# Patient Record
Sex: Male | Born: 1970
Health system: Southern US, Community
[De-identification: ages and names within clinical notes are randomized; demographics above are authoritative.]

## PROBLEM LIST (undated history)

## (undated) DIAGNOSIS — F988 Other specified behavioral and emotional disorders with onset usually occurring in childhood and adolescence: Secondary | ICD-10-CM

## (undated) DIAGNOSIS — M5020 Other cervical disc displacement, unspecified cervical region: Secondary | ICD-10-CM

## (undated) DIAGNOSIS — M542 Cervicalgia: Secondary | ICD-10-CM

## (undated) HISTORY — DX: Cervicalgia: M54.2

## (undated) HISTORY — PX: KNEE ARTHROSCOPY: SUR90

## (undated) HISTORY — DX: Other specified behavioral and emotional disorders with onset usually occurring in childhood and adolescence: F98.8

## (undated) HISTORY — DX: Other cervical disc displacement, unspecified cervical region: M50.20

---

## 2005-04-26 ENCOUNTER — Ambulatory Visit: Payer: Self-pay | Admitting: Family Medicine

## 2006-06-03 ENCOUNTER — Ambulatory Visit: Payer: Self-pay | Admitting: Internal Medicine

## 2006-06-04 ENCOUNTER — Ambulatory Visit: Payer: Self-pay | Admitting: Cardiology

## 2006-07-11 ENCOUNTER — Ambulatory Visit: Payer: Self-pay | Admitting: Internal Medicine

## 2006-10-21 ENCOUNTER — Ambulatory Visit: Payer: Self-pay | Admitting: Internal Medicine

## 2006-11-08 ENCOUNTER — Ambulatory Visit: Payer: Self-pay | Admitting: *Deleted

## 2006-11-11 ENCOUNTER — Ambulatory Visit: Payer: Self-pay | Admitting: Internal Medicine

## 2006-11-13 DIAGNOSIS — F988 Other specified behavioral and emotional disorders with onset usually occurring in childhood and adolescence: Secondary | ICD-10-CM

## 2007-01-06 ENCOUNTER — Ambulatory Visit: Payer: Self-pay | Admitting: *Deleted

## 2007-05-12 ENCOUNTER — Telehealth: Payer: Self-pay | Admitting: Internal Medicine

## 2007-06-24 ENCOUNTER — Telehealth (INDEPENDENT_AMBULATORY_CARE_PROVIDER_SITE_OTHER): Payer: Self-pay | Admitting: *Deleted

## 2007-07-18 ENCOUNTER — Ambulatory Visit: Payer: Self-pay | Admitting: Internal Medicine

## 2007-07-18 LAB — CONVERTED CEMR LAB
Bilirubin Urine: NEGATIVE
Blood in Urine, dipstick: NEGATIVE
Nitrite: NEGATIVE
Urobilinogen, UA: NEGATIVE

## 2007-07-22 ENCOUNTER — Ambulatory Visit: Payer: Self-pay | Admitting: Internal Medicine

## 2007-08-01 ENCOUNTER — Telehealth (INDEPENDENT_AMBULATORY_CARE_PROVIDER_SITE_OTHER): Payer: Self-pay | Admitting: *Deleted

## 2007-08-05 ENCOUNTER — Encounter: Payer: Self-pay | Admitting: Internal Medicine

## 2007-09-10 ENCOUNTER — Telehealth (INDEPENDENT_AMBULATORY_CARE_PROVIDER_SITE_OTHER): Payer: Self-pay | Admitting: *Deleted

## 2007-10-20 ENCOUNTER — Telehealth (INDEPENDENT_AMBULATORY_CARE_PROVIDER_SITE_OTHER): Payer: Self-pay | Admitting: *Deleted

## 2007-11-21 ENCOUNTER — Telehealth (INDEPENDENT_AMBULATORY_CARE_PROVIDER_SITE_OTHER): Payer: Self-pay | Admitting: *Deleted

## 2008-01-01 ENCOUNTER — Telehealth (INDEPENDENT_AMBULATORY_CARE_PROVIDER_SITE_OTHER): Payer: Self-pay | Admitting: *Deleted

## 2008-02-10 ENCOUNTER — Ambulatory Visit: Payer: Self-pay | Admitting: Internal Medicine

## 2008-03-05 ENCOUNTER — Telehealth: Payer: Self-pay | Admitting: Internal Medicine

## 2008-04-12 ENCOUNTER — Telehealth (INDEPENDENT_AMBULATORY_CARE_PROVIDER_SITE_OTHER): Payer: Self-pay | Admitting: *Deleted

## 2008-05-19 ENCOUNTER — Telehealth (INDEPENDENT_AMBULATORY_CARE_PROVIDER_SITE_OTHER): Payer: Self-pay | Admitting: *Deleted

## 2008-07-02 ENCOUNTER — Telehealth (INDEPENDENT_AMBULATORY_CARE_PROVIDER_SITE_OTHER): Payer: Self-pay | Admitting: *Deleted

## 2008-08-11 ENCOUNTER — Telehealth (INDEPENDENT_AMBULATORY_CARE_PROVIDER_SITE_OTHER): Payer: Self-pay | Admitting: *Deleted

## 2008-09-13 ENCOUNTER — Telehealth: Payer: Self-pay | Admitting: Internal Medicine

## 2008-10-12 ENCOUNTER — Telehealth (INDEPENDENT_AMBULATORY_CARE_PROVIDER_SITE_OTHER): Payer: Self-pay | Admitting: *Deleted

## 2008-11-10 ENCOUNTER — Telehealth (INDEPENDENT_AMBULATORY_CARE_PROVIDER_SITE_OTHER): Payer: Self-pay | Admitting: *Deleted

## 2008-11-17 ENCOUNTER — Ambulatory Visit: Payer: Self-pay | Admitting: Internal Medicine

## 2009-01-12 ENCOUNTER — Telehealth (INDEPENDENT_AMBULATORY_CARE_PROVIDER_SITE_OTHER): Payer: Self-pay | Admitting: *Deleted

## 2009-02-09 ENCOUNTER — Telehealth (INDEPENDENT_AMBULATORY_CARE_PROVIDER_SITE_OTHER): Payer: Self-pay | Admitting: *Deleted

## 2009-03-16 ENCOUNTER — Telehealth (INDEPENDENT_AMBULATORY_CARE_PROVIDER_SITE_OTHER): Payer: Self-pay | Admitting: *Deleted

## 2009-04-11 ENCOUNTER — Telehealth (INDEPENDENT_AMBULATORY_CARE_PROVIDER_SITE_OTHER): Payer: Self-pay | Admitting: *Deleted

## 2009-04-22 ENCOUNTER — Ambulatory Visit: Payer: Self-pay | Admitting: Internal Medicine

## 2009-06-20 ENCOUNTER — Telehealth (INDEPENDENT_AMBULATORY_CARE_PROVIDER_SITE_OTHER): Payer: Self-pay | Admitting: *Deleted

## 2009-07-22 ENCOUNTER — Telehealth (INDEPENDENT_AMBULATORY_CARE_PROVIDER_SITE_OTHER): Payer: Self-pay | Admitting: *Deleted

## 2009-09-12 ENCOUNTER — Encounter: Payer: Self-pay | Admitting: Internal Medicine

## 2009-09-13 ENCOUNTER — Telehealth (INDEPENDENT_AMBULATORY_CARE_PROVIDER_SITE_OTHER): Payer: Self-pay | Admitting: *Deleted

## 2009-09-16 ENCOUNTER — Encounter: Payer: Self-pay | Admitting: Internal Medicine

## 2010-01-25 ENCOUNTER — Telehealth (INDEPENDENT_AMBULATORY_CARE_PROVIDER_SITE_OTHER): Payer: Self-pay | Admitting: *Deleted

## 2010-02-13 ENCOUNTER — Ambulatory Visit: Payer: Self-pay | Admitting: Internal Medicine

## 2010-02-13 ENCOUNTER — Encounter (INDEPENDENT_AMBULATORY_CARE_PROVIDER_SITE_OTHER): Payer: Self-pay | Admitting: *Deleted

## 2010-03-07 ENCOUNTER — Ambulatory Visit: Payer: Self-pay | Admitting: Internal Medicine

## 2010-04-18 ENCOUNTER — Telehealth (INDEPENDENT_AMBULATORY_CARE_PROVIDER_SITE_OTHER): Payer: Self-pay | Admitting: *Deleted

## 2010-06-09 ENCOUNTER — Ambulatory Visit: Payer: Self-pay | Admitting: Internal Medicine

## 2010-06-09 DIAGNOSIS — F411 Generalized anxiety disorder: Secondary | ICD-10-CM | POA: Insufficient documentation

## 2010-06-13 ENCOUNTER — Ambulatory Visit: Payer: Self-pay | Admitting: Internal Medicine

## 2010-06-16 LAB — CONVERTED CEMR LAB
Albumin: 4.4 g/dL (ref 3.5–5.2)
Alkaline Phosphatase: 50 units/L (ref 39–117)
BUN: 16 mg/dL (ref 6–23)
Basophils Relative: 0.8 % (ref 0.0–3.0)
Bilirubin, Direct: 0.1 mg/dL (ref 0.0–0.3)
Calcium: 9.4 mg/dL (ref 8.4–10.5)
Chloride: 100 meq/L (ref 96–112)
Cholesterol: 223 mg/dL — ABNORMAL HIGH (ref 0–200)
Creatinine, Ser: 0.9 mg/dL (ref 0.4–1.5)
Direct LDL: 158 mg/dL
GFR calc non Af Amer: 101.84 mL/min (ref >60)
HCT: 47.2 % (ref 39.0–52.0)
HDL: 60.9 mg/dL (ref >39.00)
Lymphocytes Relative: 27.8 % (ref 12.0–46.0)
Lymphs Abs: 1.3 10*3/uL (ref 0.7–4.0)
MCV: 87.4 fL (ref 78.0–100.0)
Monocytes Absolute: 0.7 10*3/uL (ref 0.1–1.0)
Neutro Abs: 2.4 10*3/uL (ref 1.4–7.7)
Neutrophils Relative %: 53.1 % (ref 43.0–77.0)
PSA: 0.47 ng/mL (ref 0.10–4.00)
Potassium: 4.9 meq/L (ref 3.5–5.1)
Sodium: 140 meq/L (ref 135–145)
TSH: 1.07 microintl units/mL (ref 0.35–5.50)
Total Bilirubin: 0.8 mg/dL (ref 0.3–1.2)
Total CHOL/HDL Ratio: 4
Triglycerides: 69 mg/dL (ref 0.0–149.0)
WBC: 4.6 10*3/uL (ref 4.5–10.5)

## 2010-06-30 ENCOUNTER — Telehealth (INDEPENDENT_AMBULATORY_CARE_PROVIDER_SITE_OTHER): Payer: Self-pay | Admitting: *Deleted

## 2010-07-12 ENCOUNTER — Ambulatory Visit: Payer: Self-pay | Admitting: Internal Medicine

## 2010-08-08 ENCOUNTER — Telehealth (INDEPENDENT_AMBULATORY_CARE_PROVIDER_SITE_OTHER): Payer: Self-pay | Admitting: *Deleted

## 2010-09-13 ENCOUNTER — Telehealth (INDEPENDENT_AMBULATORY_CARE_PROVIDER_SITE_OTHER): Payer: Self-pay | Admitting: *Deleted

## 2010-10-11 ENCOUNTER — Telehealth (INDEPENDENT_AMBULATORY_CARE_PROVIDER_SITE_OTHER): Payer: Self-pay | Admitting: *Deleted

## 2010-11-10 ENCOUNTER — Telehealth (INDEPENDENT_AMBULATORY_CARE_PROVIDER_SITE_OTHER): Payer: Self-pay | Admitting: *Deleted

## 2010-11-15 ENCOUNTER — Telehealth (INDEPENDENT_AMBULATORY_CARE_PROVIDER_SITE_OTHER): Payer: Self-pay | Admitting: *Deleted

## 2010-11-16 ENCOUNTER — Telehealth: Payer: Self-pay | Admitting: Internal Medicine

## 2010-11-17 ENCOUNTER — Encounter: Payer: Self-pay | Admitting: Internal Medicine

## 2010-11-20 ENCOUNTER — Telehealth: Payer: Self-pay | Admitting: Internal Medicine

## 2010-11-20 ENCOUNTER — Encounter: Payer: Self-pay | Admitting: Internal Medicine

## 2010-12-05 NOTE — Progress Notes (Signed)
Summary: refill  Phone Note Refill Request Message from:  Patient on October 11, 2010 8:51 AM  Refills Requested: Medication #1:  ADDERALL XR 30 MG  CP24 1 by mouth. patient will pick up to day if possible [id number] --- he will call before coming in  Initial call taken by: Okey Regal Spring,  October 11, 2010 8:52 AM    Prescriptions: ADDERALL XR 30 MG  CP24 (AMPHETAMINE-DEXTROAMPHETAMINE) 1 by mouth  #30 x 0   Entered by:   Army Fossa CMA   Authorized by:   Nolon Rod. Paz MD   Signed by:   Army Fossa CMA on 10/11/2010   Method used:   Print then Give to Patient   RxID:   [id number]

## 2010-12-05 NOTE — Assessment & Plan Note (Signed)
Summary: cpx//pt will be fasting//lch   Vital Signs:  Patient profile:   40 year old male Height:      66 inches Weight:      157 pounds Pulse rate:   80 / minute Pulse rhythm:   regular BP sitting:   122 / 76  (left arm) Cuff size:   regular  Vitals Entered By: Army Fossa CMA (June 09, 2010 8:26 AM) CC: CPX: not fasting- had a breakfast bar. Comments Pt states he has had Td- unsure exactly when it was.    History of Present Illness: CPX feels well Admits to stress due to work, his divorce is not finalized Sleeps 8 hours but sometimes is interrupted He is very active, intense exercise one or 2 hours a day Feels fatigues some days His appetite is not the best but he does get his nutrition because he "forced himself to eat ". Part of the problem with nutrition is the work hours that he has  Preventive Screening-Counseling & Management  Alcohol-Tobacco     Alcohol drinks/day: <1  Caffeine-Diet-Exercise     Does Patient Exercise: yes     Times/week: 7  Current Medications (verified): 1)  Adderall Xr 30 Mg  Cp24 (Amphetamine-Dextroamphetamine) .Marland Kitchen.. 1 By Mouth  Allergies (verified): No Known Drug Allergies  Past History:  Past Medical History: Reviewed history from 02/10/2008 and no changes required. 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 CV:  Denies chest pain or discomfort, palpitations, and swelling of feet. Resp:  Denies cough, shortness of breath, and wheezing. GI:  Denies bloody stools, diarrhea, nausea, and vomiting. GU:  Denies dysuria and hematuria. Neuro:  Denies headaches.  Physical Exam  General:   alert, well-developed, and well-nourished.   Neck:  no masses and no thyromegaly.   Lungs:  normal respiratory effort, no intercostal retractions, no accessory muscle use, and normal breath sounds.   Heart:  normal rate, regular rhythm, no murmur, and no gallop.   Abdomen:  soft, non-tender, no distention, no masses, no guarding, and no rigidity.   Rectal:  No external abnormalities noted. Normal sphincter tone. No rectal masses or tenderness.Hemoccult negative Prostate:  Prostate gland firm and smooth, no enlargement, nodularity, tenderness, mass, asymmetry or induration. Extremities:  no edema Psych:  Oriented X3, memory intact for recent and remote, normally interactive, good eye contact, not anxious appearing, and not depressed appearing.     Impression & Recommendations:  Problem # 1:  ROUTINE GENERAL MEDICAL EXAM@HEALTH  CARE FACL (ICD-V70.0) last Td? continue with his healthy lifestyle Recommend to keep an eye on his nutrition, will see a  nutritionist for his son, he will ask her about his own nutrition as well  his weight has fluctuated some but is in general stable His father had colon cancer diagnosed at age 54 His uncle had prostate cancer diagnosed at age 66 Hemoccult-Negative today Will check a PSA every 2 years until age 30 and do a Hemoccults Labs  Problem # 2:  ANXIETY (ICD-300.00) some anxiety, no depression counseled counselor? medicine? will call if thinks he needs something   Complete Medication List: 1)  Adderall Xr 30 Mg Cp24 (Amphetamine-dextroamphetamine) .Marland Kitchen.. 1 by mouth  Patient Instructions: 1)  came back fasting 2)  FLP, BMP, CBC, TSH, LFTs, PSA---v70 3)  Please schedule a follow-up appointment in 6 months .    Risk Factors:     Drinks per day:  <1 Exercise:  yes    Times per week:  7

## 2010-12-05 NOTE — Progress Notes (Signed)
Summary: RX  Phone Note Call from Patient Call back at [facility] Phone [phone]   Caller: Patient Reason for Call: Refill Medication Summary of Call: ADDERALL 30 MG EX RELEASE.PLEASE CALL WHEN READY Initial call taken by: Freddy Jaksch,  August 08, 2010 10:18 AM  Follow-up for Phone Call        Left message for pt that rx is ready. Army Fossa CMA  August 08, 2010 10:31 AM     Prescriptions: ADDERALL XR 30 MG  CP24 (AMPHETAMINE-DEXTROAMPHETAMINE) 1 by mouth  #30 x 0   Entered by:   Army Fossa CMA   Authorized by:   Nolon Rod. Paz MD   Signed by:   Army Fossa CMA on 08/08/2010   Method used:   Print then Give to Patient   RxID:   [id number]

## 2010-12-05 NOTE — Assessment & Plan Note (Signed)
Summary: RT ANKLE SPRAIN/RH.......Marland Kitchen   Vital Signs:  Patient profile:   40 year old male Weight:      156.25 pounds Pulse rate:   118 / minute Pulse rhythm:   regular BP sitting:   118 / 76  (left arm) Cuff size:   regular  Vitals Entered By: Army Fossa CMA (July 12, 2010 4:17 PM) CC: R Ankle sprain Comments x 1 week ago. Swollen  Using advil   History of Present Illness: injury his right foot running, last year the same symptoms improved dramatically with a local injection in the tendon of the foot. He was under the impression that I can do that. We cancelled  this visit He will go as soon as possible to his orthopedic surgeon for a local injection. He will call me if he needs a referral  Current Medications (verified): 1)  Adderall Xr 30 Mg  Cp24 (Amphetamine-Dextroamphetamine) .Marland Kitchen.. 1 By Mouth  Allergies (verified): No Known Drug Allergies   Complete Medication List: 1)  Adderall Xr 30 Mg Cp24 (Amphetamine-dextroamphetamine) .Marland Kitchen.. 1 by mouth  Other Orders: No Charge Patient Arrived (NCPA0) (NCPA0)

## 2010-12-05 NOTE — Progress Notes (Signed)
Summary: refill  Phone Note Refill Request Call back at Home Phone [phone]   Refills Requested: Medication #1:  ADDERALL XR 30 MG  CP24 1 by mouth - DUE OFFICE VISIT. call patient when ready   Method Requested: Pick up at Office Initial call taken by: Okey Regal Spring,  April 18, 2010 11:17 AM  Follow-up for Phone Call        left pt detail message rx ready for pick-up.............Marland KitchenFelecia Deloach CMA  April 18, 2010 11:49 AM     New/Updated Medications: ADDERALL XR 30 MG  CP24 (AMPHETAMINE-DEXTROAMPHETAMINE) 1 by mouth Prescriptions: ADDERALL XR 30 MG  CP24 (AMPHETAMINE-DEXTROAMPHETAMINE) 1 by mouth  #30 x 0   Entered by:   Jeremy Johann CMA   Authorized by:   Nolon Rod. Paz MD   Signed by:   Jeremy Johann CMA on 04/18/2010   Method used:   Print then Give to Patient   RxID:   [id number]

## 2010-12-05 NOTE — Assessment & Plan Note (Signed)
Summary: rto/cbs missed appt [id number]   Vital Signs:  Patient profile:   40 year old male Height:      66 inches Weight:      163 pounds BMI:     26.40 Pulse rate:   80 / minute BP sitting:   110 / 80  Vitals Entered By: Shary Decamp (Mar 07, 2010 11:01 AM)  History of Present Illness: here for adderall RF , works well  Current Medications (verified): 1)  Adderall Xr 30 Mg  Cp24 (Amphetamine-Dextroamphetamine) .Marland Kitchen.. 1 By Mouth - Due Office Visit  Allergies (verified): No Known Drug Allergies  Past History:  Past Medical History: Reviewed history from 02/10/2008 and no changes required. ADD  Past Surgical History: R knee arthroscopy  Social History: Reviewed history from 04/22/2009 and no changes required. 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  Physical Exam  General:  alert, well-developed, and well-nourished.   Neck:  no masses and no thyromegaly.   Lungs:  normal respiratory effort, no intercostal retractions, no accessory muscle use, and normal breath sounds.   Heart:  normal rate, regular rhythm, and no murmur.   Extremities:  no pretibial edema bilaterally    Impression & Recommendations:  Problem # 1:  ADD (ICD-314.00) well controlled   Problem # 2:  also we talk about his health, rec to have a CPX , states he will RTC in 3 months  Complete Medication List: 1)  Adderall Xr 30 Mg Cp24 (Amphetamine-dextroamphetamine) .Marland Kitchen.. 1 by mouth - due office visit  Patient Instructions: 1)  Please schedule a follow-up appointment in 1 year.  Prescriptions: ADDERALL XR 30 MG  CP24 (AMPHETAMINE-DEXTROAMPHETAMINE) 1 by mouth - DUE OFFICE VISIT  #30 x 0   Entered by:   Shary Decamp   Authorized by:   Nolon Rod. Darus Hershman MD   Signed by:   Shary Decamp on 03/07/2010   Method used:   Print then Give to Patient   RxID:    [id number]

## 2010-12-05 NOTE — Progress Notes (Signed)
Summary: ADDERALL RX  Phone Note Refill Request Call back at Home Phone [phone] Message from:  Patient  Refills Requested: Medication #1:  ADDERALL XR 30 MG  CP24 1 by mouth - DUE OFFICE VISIT. LEFT MSG FOR PATIENT DUE FOR OFFICE VISIIT, SCHEDULE OV WHEN PICKING UPT THIS RX  Initial call taken by: Kandice Hams,  January 25, 2010 9:44 AM  Follow-up for Phone Call        LEFT MSG FOR PATIENT DUE FOR OFFICE VISIT,.  SCHEDULE OV WHEN PICKING UP THIS SCRIPT .Kandice Hams  January 25, 2010 9:56 AM  Follow-up by: Kandice Hams,  January 25, 2010 9:57 AM    Prescriptions: ADDERALL XR 30 MG  CP24 (AMPHETAMINE-DEXTROAMPHETAMINE) 1 by mouth - DUE OFFICE VISIT  #30 x 0   Entered by:   Kandice Hams   Authorized by:   Nolon Rod. Paz MD   Signed by:   Kandice Hams on 01/25/2010   Method used:   Print then Give to Patient   RxID:   [id number]

## 2010-12-05 NOTE — Progress Notes (Signed)
Summary: adderral xr refill  Phone Note Refill Request Call back at Home Phone [phone] Message from:  Patient on September 13, 2010 9:04 AM  Refills Requested: Medication #1:  ADDERALL XR 30 MG  CP24 1 by mouth. Patient has talked to [facility] have "XR" in stock.  Initial call taken by: Jerolyn Shin,  September 13, 2010 9:07 AM  Follow-up for Phone Call        left message that rx was ready. Army Fossa CMA  September 13, 2010 9:37 AM     Prescriptions: ADDERALL XR 30 MG  CP24 (AMPHETAMINE-DEXTROAMPHETAMINE) 1 by mouth  #30 x 0   Entered by:   Army Fossa CMA   Authorized by:   Nolon Rod. Paz MD   Signed by:   Army Fossa CMA on 09/13/2010   Method used:   Print then Give to Patient   RxID:   [id number]

## 2010-12-05 NOTE — Progress Notes (Signed)
Summary: REFILL REQUEST  Phone Note Refill Request Call back at Home Phone [phone] Message from:  Patient on June 30, 2010 1:48 PM  Refills Requested: Medication #1:  ADDERALL XR 30 MG  CP24 1 by mouth.   Dosage confirmed as above?Dosage Confirmed   Supply Requested: 1 month PT AWARE READY FOR PICKUP BY MONDAY AFTERNOON  Next Appointment Scheduled: NONE Initial call taken by: Lavell Islam,  June 30, 2010 1:49 PM    Prescriptions: ADDERALL XR 30 MG  CP24 (AMPHETAMINE-DEXTROAMPHETAMINE) 1 by mouth  #30 x 0   Entered by:   Army Fossa CMA   Authorized by:   Nolon Rod. Paz MD   Signed by:   Army Fossa CMA on 06/30/2010   Method used:   Print then Give to Patient   RxID:   [id number]

## 2010-12-05 NOTE — Letter (Signed)
Summary: [place] No Show Letter  [place] [facility] [place]  [address] [place], [place] [address]   Phone: [phone]  Fax: [phone]    02/13/2010 MRN: [id number]  Howard Guardia [address] [place], [place]  [address]   Dear Mr. Guier,   Our records indicate that you missed your scheduled appointment with Dr. Drue Novel on 02/13/10.  Please contact this office to reschedule your appointment as soon as possible.  It is important that you keep your scheduled appointments with your physician, so we can provide you the best care possible.  Please be advised that there may be a charge for "no show" appointments.    Sincerely,   [facility]

## 2010-12-07 NOTE — Progress Notes (Signed)
Summary: needs phone call today to [facility] to get medciatiion  Phone Note Call from Patient   Caller: Patient Summary of Call: patient is going out of town at 5:30 am tomorrow--needs his adderral---needs [place] to call [employer] at [phone] and give verbal approval to patient's adderral 30---has new insurance through wife Siegfried Vieth   ID# [id number] Initial call taken by: Jerolyn Shin,  November 15, 2010 4:39 PM  Follow-up for Phone Call        I spoke w/ a [employer] Representive- she states that this medication is not covered on his plan. I spoke w/ the pt he is aware. He is going to call [employer] and see because they told him differently. Army Fossa CMA  November 15, 2010 4:56 PM

## 2010-12-07 NOTE — Medication Information (Signed)
Summary: Prior Authorization Request for Dextroamphetamine Amph Cap Sr.  Prior Authorization Request for Dextroamphetamine Amph Cap Sr.   Imported By: Maryln Gottron 11/29/2010 15:23:54  _____________________________________________________________________  External Attachment:    Type:   Image     Comment:   External Document

## 2010-12-07 NOTE — Progress Notes (Signed)
Summary: Adderall rx   Phone Note Refill Request Message from:  Patient on November 20, 2010 2:31 PM  Refills Requested: Medication #1:  ADDERALL XR 30 MG  CP24 1 by mouth. Patient left message on triage that due to prior authorization status he paid out of pocket for 5 tablets to get him by. This voided the prescription that was given and patient needs a new one.   Initial call taken by: Lucious Groves CMA,  November 20, 2010 2:31 PM    Prescriptions: ADDERALL XR 30 MG  CP24 (AMPHETAMINE-DEXTROAMPHETAMINE) 1 by mouth  #30 x 0   Entered by:   Lucious Groves CMA   Authorized by:   Nolon Rod. Mariesa Grieder MD   Signed by:   Lucious Groves CMA on 11/20/2010   Method used:   Print then Give to Patient   RxID:   [id number]

## 2010-12-07 NOTE — Progress Notes (Signed)
Summary: Prior Auth--Adderall  Phone Note Refill Request Call back at [phone] Message from:  Pharmacy on November 16, 2010 8:49 AM  Refills Requested: Medication #1:  ADDERALL XR 30 MG  CP24 1 by mouth.   Dosage confirmed as above?Dosage Confirmed Prior Auth from [employer] on [address]  ID#: [id number]  Initial call taken by: Harold Barban,  November 16, 2010 8:49 AM  Follow-up for Phone Call        Forms received and pending sig. Lucious Groves CMA  November 16, 2010 9:38 AM   Completed and faxed, will await reply from insurance company. Lucious Groves CMA  November 16, 2010 9:41 AM      Appended Document: Prior Auth--Adderall Approved until 11/17/2011.

## 2010-12-07 NOTE — Medication Information (Signed)
Summary: Approval for Dextromphetamine-Amph Cap. Sr  Approval for Dextromphetamine-Amph Cap. Sr   Imported By: Maryln Gottron 11/29/2010 14:57:15  _____________________________________________________________________  External Attachment:    Type:   Image     Comment:   External Document

## 2010-12-07 NOTE — Progress Notes (Signed)
Summary: refill  Phone Note Refill Request Call back at Home Phone [phone] Message from:  Patient on November 10, 2010 11:09 AM  Refills Requested: Medication #1:  ADDERALL XR 30 MG  CP24 1 by mouth.   Dosage confirmed as above?Dosage Confirmed   Supply Requested: 1 month please call when ready   Method Requested: Pick up at Office Initial call taken by: Lavell Islam,  November 10, 2010 11:09 AM  Follow-up for Phone Call        left detailed message for pt that rx was ready and he needed to make an OV next month. Army Fossa CMA  November 10, 2010 11:24 AM     Prescriptions: ADDERALL XR 30 MG  CP24 (AMPHETAMINE-DEXTROAMPHETAMINE) 1 by mouth  #30 x 0   Entered by:   Army Fossa CMA   Authorized by:   Nolon Rod. Paz MD   Signed by:   Army Fossa CMA on 11/10/2010   Method used:   Print then Give to Patient   RxID:   [id number]

## 2010-12-19 ENCOUNTER — Ambulatory Visit (INDEPENDENT_AMBULATORY_CARE_PROVIDER_SITE_OTHER): Payer: BC Managed Care – PPO | Admitting: Internal Medicine

## 2010-12-19 ENCOUNTER — Encounter: Payer: Self-pay | Admitting: Internal Medicine

## 2010-12-19 DIAGNOSIS — F411 Generalized anxiety disorder: Secondary | ICD-10-CM

## 2010-12-19 DIAGNOSIS — F988 Other specified behavioral and emotional disorders with onset usually occurring in childhood and adolescence: Secondary | ICD-10-CM

## 2010-12-27 NOTE — Assessment & Plan Note (Signed)
Summary: FOLLOWUP, WILL NEED ADDERAL PRES//SPH   Vital Signs:  Patient profile:   40 year old male Height:      66 inches Weight:      161.38 pounds BMI:     26.14 Pulse rate:   83 / minute Pulse rhythm:   regular BP sitting:   120 / 82  (left arm) Cuff size:   regular  Vitals Entered By: Army Fossa CMA (December 19, 2010 2:51 PM) CC: Follow up visit- Refill on adderall    History of Present Illness: routine office visit History of ADD, well controlled with current medications.   Current Medications (verified): 1)  Adderall Xr 30 Mg  Cp24 (Amphetamine-Dextroamphetamine) .Marland Kitchen.. 1 By Mouth  Allergies (verified): No Known Drug Allergies  Past History:  Past Medical History: Reviewed history from 02/10/2008 and no changes required. ADD  Past Surgical History: Reviewed history from 06/09/2010 and no changes required. 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 He is happy. his son is closer to him than  his daughter. He remains active, practicing for a triathlon. Sleeps well, appetite normal.  Physical Exam  General:  alert, well-developed, and well-nourished.   Psych:  Oriented X3, memory intact for recent and remote, normally interactive, good eye contact, not anxious appearing, and not depressed appearing.     Impression & Recommendations:  Problem # 1:  ADD (ICD-314.00) RF  Problem # 2:  ANXIETY (ICD-300.00) Well controlled at present, he seems to be doing well, see review of systems  Problem # 3:  last FLP reviewed, will recheck when he comes back for his physical  Complete Medication List: 1)  Adderall Xr 30 Mg Cp24 (Amphetamine-dextroamphetamine) .Marland Kitchen.. 1 by mouth  Patient Instructions: 1)  Please schedule a follow-up appointment in 6 to 8 months . Fasting physical    Prescriptions: ADDERALL XR 30 MG  CP24 (AMPHETAMINE-DEXTROAMPHETAMINE) 1 by mouth  #30 x 0   Entered by:   Army Fossa CMA   Authorized by:   Nolon Rod. Nickolaos Brallier MD   Signed by:   Army Fossa CMA on 12/19/2010   Method used:   Print then Give to Patient   RxID:   [id number]    Orders Added: 1)  Est. Patient Level III [61607]

## 2011-01-24 ENCOUNTER — Other Ambulatory Visit: Payer: Self-pay | Admitting: Internal Medicine

## 2011-01-24 MED ORDER — AMPHETAMINE-DEXTROAMPHET ER 30 MG PO CP24: 30.0000 mg | ORAL_CAPSULE | ORAL | Status: DC

## 2011-02-21 ENCOUNTER — Ambulatory Visit (INDEPENDENT_AMBULATORY_CARE_PROVIDER_SITE_OTHER): Payer: BC Managed Care – PPO | Admitting: Internal Medicine

## 2011-02-21 ENCOUNTER — Encounter: Payer: Self-pay | Admitting: Internal Medicine

## 2011-02-21 VITALS — BP 118/80 | HR 88 | Temp 98.3°F | Wt 163.4 lb

## 2011-02-21 DIAGNOSIS — J209 Acute bronchitis, unspecified: Secondary | ICD-10-CM

## 2011-02-21 MED ORDER — AZITHROMYCIN 250 MG PO TABS: 250.0000 mg | ORAL_TABLET | Freq: Every day | ORAL | Status: AC

## 2011-02-21 MED ORDER — HYDROCODONE-HOMATROPINE 5-1.5 MG/5ML PO SYRP: 5.0000 mL | ORAL_SOLUTION | Freq: Four times a day (QID) | ORAL | Status: AC | PRN

## 2011-02-21 NOTE — Patient Instructions (Signed)
Drink as much nondairy fluids  As you  can over the next few days. Plain Mucinex  Is  helpful in thinning secretions. The cough syrup can  be taken 1 teaspoon every 6 hours as needed for cough.

## 2011-02-21 NOTE — Progress Notes (Signed)
  Subjective:    Patient ID: Kyle Dunn, male    DOB: Dec 27, 1970, 40 y.o.   MRN: [id number]  HPI On Sunday 4/15 he noted fatigue; on 4/16 he developed a hacking cough. Subsequently he developed laryngitis as of yesterday.  He denies significant upper respiratory tract symptoms. He specifically denies facial pain , frontal headaches ,dental pain or nasal purulence. He has had some mild sore throat & has  noted some tender lymphadenopathy in the neck.  He describes his sputum as "milky brown"  He has dyspnea without wheezing. He has no history of asthma and is not a smoker. In fact he exercises  @ a  high-level, running and biking.    Review of Systems     Objective:   Physical Exam on exam he appears well-nourished and well-developed. He is in no acute distress.  Otic canals and tympanic membranes are normal. The nares are dry without exudate. He has mild erythema of  the oropharynx. Dental  hygiene is good.  He has mild cervical lymphadenopathy.  He exhibits a slow S4 without significant murmurs or gallops.  Chest is clear without rales, rhonchi, or wheezing. He does exhibit dry hacking cough.  He has no clubbing or cyanosis.        Assessment & Plan:  #1 he describes bronchitis, an atypical organism is suspect  Plan: #1 azithromycin  #2 Hydromet

## 2011-02-22 ENCOUNTER — Telehealth: Payer: Self-pay | Admitting: *Deleted

## 2011-02-22 NOTE — Telephone Encounter (Signed)
[facility] Triage Call Report Triage Record Num: [id number] Operator: Audelia Hives Patient Name: Edmond Ginsberg Call Date & Time: 02/21/2011 8:05:45PM Patient Phone: [phone] PCP: Marga Melnick Patient Gender: Male PCP Fax : [phone] Patient DOB: [birth year] Practice Name: [facility] Reason for Call: Pt calling regarding his meds not at pharmacy, states was seen by Dr. Alwyn Ren and Z-pack prescribed for lung infection. Was to be sent at [facility] [address] Spoke with Dr. Artist Pais and new orders for Zpack 250 mg 2 tabs day 1 then 1 tab po qd x 4 days and Hydromet 5/1.5 mg take 5 ml q 6 prn for cough 120 ml no refills and called to [address] [phone]. Protocol(s) Used: Office Note Recommended Outcome per Protocol: Information Noted and Sent to Office Reason for Outcome: Caller information to office Care Advice: ~ 04/

## 2011-03-06 ENCOUNTER — Telehealth: Payer: Self-pay | Admitting: Internal Medicine

## 2011-03-06 MED ORDER — AMPHETAMINE-DEXTROAMPHET ER 30 MG PO CP24: 30.0000 mg | ORAL_CAPSULE | ORAL | Status: DC

## 2011-03-06 NOTE — Telephone Encounter (Signed)
Will place up front

## 2011-03-06 NOTE — Telephone Encounter (Signed)
Refill adderall xr 30 mg - patient has appt [id number] afternoon she will pick up

## 2011-03-23 NOTE — Assessment & Plan Note (Signed)
[facility]                                   ON-CALL NOTE   NAME:Sladek, Spero  Cristal Deer             MRN:          [id number]  DATE:  06/02/2006                              DOB:      03-07-1971    PHONE CALL:  Mr. Diekman was [place] the beach.  He keeps himself fit with biking  and running so was very surprised when he started having some swelling and  pain in his testes and kind of an achy feeling.  He has been able to run,  did this morning, and stayed playing golf even though he felt achy and  really felt like something was off.  During intercourse he noticed some  blood in his semen and that really worried.   PLAN:  I talked him down from the degree of his panic over the symptoms,  told him that he had fairly classic symptoms of acute prostatitis.  I did  tell him that he could take some ibuprofen 800 mg up to t.i.d. and that he  would probably need some antibiotic treatment.  I told him his choice would  be to [place] now or he is coming back to  [facility] tomorrow and could be seen then.  He will decide on that now but  will try the Ibuprofen.                                   Karie Schwalbe, MD   RIL/MedQ  DD:  06/02/2006  DT:  06/02/2006  Job #:  [id number]   cc:   Leanne Chang, MD

## 2011-04-09 ENCOUNTER — Telehealth: Payer: Self-pay | Admitting: Internal Medicine

## 2011-04-09 MED ORDER — AMPHETAMINE-DEXTROAMPHET ER 30 MG PO CP24: 30.0000 mg | ORAL_CAPSULE | ORAL | Status: DC

## 2011-04-09 NOTE — Telephone Encounter (Signed)
rx adderall - patient will pick up Tuesday afternoon [id number]

## 2011-05-16 ENCOUNTER — Other Ambulatory Visit: Payer: Self-pay | Admitting: Internal Medicine

## 2011-05-16 NOTE — Telephone Encounter (Signed)
Refill adderall - patient will pick up Thursday [id number] °

## 2011-05-17 MED ORDER — AMPHETAMINE-DEXTROAMPHET ER 30 MG PO CP24: 30.0000 mg | ORAL_CAPSULE | ORAL | Status: DC

## 2011-05-17 NOTE — Telephone Encounter (Signed)
Placed up front

## 2011-06-25 ENCOUNTER — Other Ambulatory Visit: Payer: Self-pay | Admitting: Internal Medicine

## 2011-06-25 NOTE — Telephone Encounter (Signed)
Adderall request [last refill in [facility] 12/17/2010 #30x0]

## 2011-06-25 NOTE — Telephone Encounter (Signed)
adderall XR 30 mg  one by mouth daily when necessary, #30, no refills. Okay to refill for the next 3 months

## 2011-06-26 MED ORDER — AMPHETAMINE-DEXTROAMPHET ER 30 MG PO CP24: 30.0000 mg | ORAL_CAPSULE | ORAL | Status: DC

## 2011-06-26 NOTE — Telephone Encounter (Signed)
Rx[s] picked up at office.

## 2011-06-26 NOTE — Telephone Encounter (Signed)
Rx[s] Printed for signature.

## 2011-10-19 ENCOUNTER — Telehealth: Payer: Self-pay | Admitting: Internal Medicine

## 2011-10-19 MED ORDER — AMPHETAMINE-DEXTROAMPHET ER 30 MG PO CP24: 30.0000 mg | ORAL_CAPSULE | ORAL | Status: DC

## 2011-10-19 NOTE — Telephone Encounter (Signed)
Pt aware, Rx ready for Pick up and to schedule f/u appt

## 2011-10-19 NOTE — Telephone Encounter (Signed)
Last refilled 06-26-11 #30, last OV December 19, 2010

## 2011-10-19 NOTE — Telephone Encounter (Signed)
Refill adderall xr 30 mg patient will pick up Monday [id number]

## 2011-10-19 NOTE — Telephone Encounter (Signed)
Ok 1 month supply 

## 2011-11-29 ENCOUNTER — Encounter: Payer: Self-pay | Admitting: Internal Medicine

## 2011-11-29 ENCOUNTER — Ambulatory Visit (INDEPENDENT_AMBULATORY_CARE_PROVIDER_SITE_OTHER): Payer: BC Managed Care – PPO | Admitting: Internal Medicine

## 2011-11-29 VITALS — BP 114/82 | HR 83 | Temp 97.9°F | Wt 169.0 lb

## 2011-11-29 DIAGNOSIS — F411 Generalized anxiety disorder: Secondary | ICD-10-CM

## 2011-11-29 DIAGNOSIS — F988 Other specified behavioral and emotional disorders with onset usually occurring in childhood and adolescence: Secondary | ICD-10-CM

## 2011-11-29 MED ORDER — AMPHETAMINE-DEXTROAMPHETAMINE 30 MG PO TABS: 30.0000 mg | ORAL_TABLET | Freq: Every day | ORAL | Status: DC

## 2011-11-29 NOTE — Progress Notes (Signed)
  Subjective:    Patient ID: Cainan Trull, male    DOB: 05/17/1971, 41 y.o.   MRN: [id number]  HPI ROV Here for a refill. Feeling well, adderall  works  Past Medical History: ADD  Past Surgical History: Reviewed history from 06/09/2010 and no changes required. 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 Stress at work has decreased. He remains very active except for the last few weeks due to a foot injury.    Objective:   Physical Exam  Constitutional: He is oriented to person, place, and time. He appears well-developed and well-nourished.  Cardiovascular: Normal rate, regular rhythm and normal heart sounds.   No murmur heard. Pulmonary/Chest: Effort normal and breath sounds normal. No respiratory distress. He has no wheezes. He has no rales.  Neurological: He is oriented to person, place, and time.  Psychiatric: He has a normal mood and affect. His behavior is normal. Thought content normal.       Assessment & Plan:

## 2011-11-29 NOTE — Assessment & Plan Note (Signed)
Well-controlled, refill medicines 

## 2011-11-29 NOTE — Assessment & Plan Note (Signed)
Not an issue at the present time

## 2011-12-12 ENCOUNTER — Telehealth: Payer: Self-pay | Admitting: *Deleted

## 2011-12-12 NOTE — Telephone Encounter (Signed)
Prior auth approved 11-13-11 until 12-03-12, pharmacy notified via fax.

## 2012-01-15 ENCOUNTER — Other Ambulatory Visit: Payer: Self-pay

## 2012-01-15 MED ORDER — AMPHETAMINE-DEXTROAMPHET ER 30 MG PO CP24: 30.0000 mg | ORAL_CAPSULE | ORAL | Status: DC

## 2012-01-15 NOTE — Telephone Encounter (Signed)
Prescription printed, signed and up front for pick up.  Patient notified.

## 2012-01-21 ENCOUNTER — Telehealth: Payer: Self-pay

## 2012-01-21 NOTE — Telephone Encounter (Signed)
PA for Adderall 30 mg XR 1 po qd complete and approved until 12/03/2012.      KP

## 2012-01-30 ENCOUNTER — Ambulatory Visit: Payer: BC Managed Care – PPO | Admitting: Sports Medicine

## 2012-02-12 ENCOUNTER — Encounter: Payer: Self-pay | Admitting: Sports Medicine

## 2012-02-12 ENCOUNTER — Ambulatory Visit (INDEPENDENT_AMBULATORY_CARE_PROVIDER_SITE_OTHER): Payer: BC Managed Care – PPO | Admitting: Sports Medicine

## 2012-02-12 VITALS — BP 151/90 | HR 99 | Ht 66.0 in | Wt 165.0 lb

## 2012-02-12 DIAGNOSIS — M25572 Pain in left ankle and joints of left foot: Secondary | ICD-10-CM | POA: Insufficient documentation

## 2012-02-12 DIAGNOSIS — M76829 Posterior tibial tendinitis, unspecified leg: Secondary | ICD-10-CM

## 2012-02-12 DIAGNOSIS — M76822 Posterior tibial tendinitis, left leg: Secondary | ICD-10-CM | POA: Insufficient documentation

## 2012-02-12 DIAGNOSIS — M25579 Pain in unspecified ankle and joints of unspecified foot: Secondary | ICD-10-CM

## 2012-02-12 MED ORDER — NITROGLYCERIN 0.2 MG/HR TD PT24: MEDICATED_PATCH | TRANSDERMAL | Status: DC

## 2012-02-12 NOTE — Progress Notes (Signed)
  Subjective:    Patient ID: Kyle Dunn, male    DOB: 11-27-1970, 41 y.o.   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  The pain started in September 2012 when he hear "a pop" while running in hard orthotics.  He was treated in a boot for 3 months due to concern for possible stress fracture.  An MRI performed in December 2012 did not show any evidence of a stress fracture but instead showed posterior tibialis tendonitis.  He has been wearing an Aircast ankle brace for support and has been able to get back to biking and some running though he continues to have pain.  He has "always had flat feet."  He sees Dr. Althea Charon for most of his orthopedic care. He was referred to Dr. Earlie Counts that is that was lobular liver does have a who hadn't caused him to either quit running or consider surgery per the patient. He was interested in the nitroglycerin protocol for tendon injuries and so came here for my opinion to see if this might be helpful.  Aircast ankle brace has been helpful but very difficult to wear in his regular shoes.   Review of Systems     Objective:   Physical Exam Gen: athletic, in NAD MSK: ttp behind and inferior to the left medial malleolus and extending to the navicular, + calcaneal valgus, loss of the longitudinal arch of the foot while standing and ambulating, midfoot shift. Heel raise his left posterior tibialis is not appear to fire/  the right one does  In-office MSK ultrasound: Significant edema visualized along the posterior tibialis tendon with widening of the tendon and a spilt visualized in the tendon. Most of the changes on the posterior tibialis or near its insertion into the navicular prominence.     Assessment & Plan:  41 year old male with left posterior tibialis tendonitis causing persistent limitation of activity.  Plan: - 1/4  Nitro patch daily applied over the tendon. - Do heel raises daily while standing on a 1 inch book with feet in neutral position and pigeon-toed. - Wear x-brace for compression while ambulating and exercising. - Recheck in 1 month.

## 2012-02-12 NOTE — Patient Instructions (Signed)
Nitroglycerin Protocol   Apply 1/4 nitroglycerin patch to affected area daily.  Change position of patch within the affected area every 24 hours.  You may experience a headache during the first 1-2 weeks of using the patch, these should subside.  If you experience headaches after beginning nitroglycerin patch treatment, you may take your preferred over the counter pain reliever.  Another side effect of the nitroglycerin patch is skin irritation or rash related to patch adhesive.  Please notify our office if you develop more severe headaches or rash, and stop the patch.  Tendon healing with nitroglycerin patch may require 12 to 24 weeks depending on the extent of injury.  Men should not use if taking Viagra, Cialis, or Levitra.   Do not use if you have migraines or rosacea.   Please do suggested exercises daily  Please follow up in 6 weeks  Thank you for seeing us today!  

## 2012-02-12 NOTE — Assessment & Plan Note (Signed)
Based on the patient's MRI findings in today's ultrasound findings I think he had a partial tear of his posterior tibialis tendon that has created his ankle pain.  The ankle itself was not unstable.  He has severe breakdown of his mid foot and rear foot. The orthotics made by Dr. Althea Charon seemed to correct this well;   Continue to use the orthotics in the Aircast ankle support when the foot is painful or if he can be walking or standing too much.

## 2012-02-12 NOTE — Assessment & Plan Note (Signed)
Try a nitroglycerin protocol  Limit rehabilitation to heel raises with the foot in neutral and inverted over a 1 inch block;  otherwise he can continue to ride the bike and cross train.  Try a body helix X. brace for his regular walking shoes and I would like to put arch supports in these  Recheck and repeat the scan in 4-6 weeks  If he does well on this strategy I will return him to Dr. Althea Charon for followup

## 2012-02-18 ENCOUNTER — Ambulatory Visit (INDEPENDENT_AMBULATORY_CARE_PROVIDER_SITE_OTHER): Payer: BC Managed Care – PPO | Admitting: Internal Medicine

## 2012-02-18 ENCOUNTER — Encounter: Payer: Self-pay | Admitting: Internal Medicine

## 2012-02-18 VITALS — BP 122/78 | HR 64 | Temp 97.9°F | Wt 171.0 lb

## 2012-02-18 DIAGNOSIS — L0390 Cellulitis, unspecified: Secondary | ICD-10-CM

## 2012-02-18 MED ORDER — DOXYCYCLINE HYCLATE 100 MG PO TABS: 100.0000 mg | ORAL_TABLET | Freq: Two times a day (BID) | ORAL | Status: AC

## 2012-02-18 NOTE — Patient Instructions (Signed)
Take antibiotics as prescribed, call me if you have fever, aches, or if the infection is not getting better.

## 2012-02-18 NOTE — Progress Notes (Signed)
  Subjective:    Patient ID: Kyle Dunn, male    DOB: 05/01/1971, 41 y.o.   MRN: [id number]  HPI Acute visit Insect/bug bite 5 days ago, unsure of what it was. Area got red, slightly tender. The area was even worse yesterday.  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 No crutches or fatigue. He has not been outside [place], he has not been outside in the woods. He thinks he acquired thebite at home.    Objective:   Physical Exam  Constitutional: He appears well-developed and well-nourished. No distress.  Skin: He is not diaphoretic.          Assessment & Plan:  Cellulitis: Presents with cellulitis, the erythematous rim somehow resembles Lyme disease however he is low risk, apparently they bite was at home, he is not sure if the "bug" was a tick and he is otherwise without systemic symptoms. Will treat him nevertheless with doxycycline for one week, he is encouraged to call me if he is not getting better

## 2012-03-03 ENCOUNTER — Other Ambulatory Visit: Payer: Self-pay | Admitting: *Deleted

## 2012-03-03 MED ORDER — AMPHETAMINE-DEXTROAMPHET ER 30 MG PO CP24: 30.0000 mg | ORAL_CAPSULE | ORAL | Status: DC

## 2012-03-03 NOTE — Telephone Encounter (Signed)
Rx sent 

## 2012-03-18 ENCOUNTER — Ambulatory Visit: Payer: BC Managed Care – PPO | Admitting: Sports Medicine

## 2012-03-26 ENCOUNTER — Ambulatory Visit (INDEPENDENT_AMBULATORY_CARE_PROVIDER_SITE_OTHER): Payer: BC Managed Care – PPO | Admitting: Sports Medicine

## 2012-03-26 ENCOUNTER — Other Ambulatory Visit: Payer: Self-pay | Admitting: *Deleted

## 2012-03-26 DIAGNOSIS — R269 Unspecified abnormalities of gait and mobility: Secondary | ICD-10-CM

## 2012-03-26 DIAGNOSIS — M76829 Posterior tibial tendinitis, unspecified leg: Secondary | ICD-10-CM

## 2012-03-26 DIAGNOSIS — M76822 Posterior tibial tendinitis, left leg: Secondary | ICD-10-CM

## 2012-03-26 NOTE — Assessment & Plan Note (Signed)
Patient was fitted for a : standard, cushioned, semi-rigid orthotic. The orthotic was heated and afterward the patient stood on the orthotic blank positioned on the orthotic stand. The patient was positioned in subtalar neutral position and 10 degrees of ankle dorsiflexion in a weight bearing stance. After completion of molding, a stable base was applied to the orthotic blank. The blank was ground to a stable position for weight bearing. Size: 11 red cambrelle Base: blue EVA Posting:none Additional orthotic padding: none  35 mins  Gait is neutral after orthotics both for walking and jogging

## 2012-03-26 NOTE — Assessment & Plan Note (Signed)
Much improved on NTG protocol  Cont this for 1 more month  Repeat US on RTC and consider if he can start back some running

## 2012-03-26 NOTE — Progress Notes (Signed)
  Subjective:    Patient ID: Kyle Dunn, male    DOB: May 05, 1971, 41 y.o.   MRN: [id number]  HPI Serious triathlete Hx of post tib partial tear - came to me for 2nd opinon as he did not want surgery On NTG protocol at least 50% better after 1 week Now able to do the elliptical, bike and walk Not running yet Use X Helix brace for ankle and this has really felt good as well  Has been in orthotics for collapse of long arch from Dr Court Joy feels too firm and was put into harder orthotic at another office that made pain much worse Come for new orthoitcs   Review of Systems     Objective:   Physical Exam  NAD  Ankle not painful or swollen today Loss of long arches bilat Calcaneal valgus Still greater on left Walking gait is pronated      Assessment & Plan:

## 2012-04-21 ENCOUNTER — Other Ambulatory Visit: Payer: Self-pay | Admitting: *Deleted

## 2012-04-21 MED ORDER — AMPHETAMINE-DEXTROAMPHET ER 30 MG PO CP24: 30.0000 mg | ORAL_CAPSULE | ORAL | Status: DC

## 2012-04-21 NOTE — Telephone Encounter (Signed)
Pt called requesting a refill for adderall XR 30 #30 with zero refills. Last filled on 4.29.13. OK to refill?

## 2012-04-21 NOTE — Telephone Encounter (Signed)
Ok RF 

## 2012-04-21 NOTE — Telephone Encounter (Signed)
Refill done.  

## 2012-04-28 ENCOUNTER — Other Ambulatory Visit: Payer: BC Managed Care – PPO | Admitting: Sports Medicine

## 2012-05-27 ENCOUNTER — Ambulatory Visit (INDEPENDENT_AMBULATORY_CARE_PROVIDER_SITE_OTHER): Payer: BC Managed Care – PPO | Admitting: Sports Medicine

## 2012-05-27 VITALS — BP 118/78

## 2012-05-27 DIAGNOSIS — M25579 Pain in unspecified ankle and joints of unspecified foot: Secondary | ICD-10-CM

## 2012-05-27 DIAGNOSIS — M76822 Posterior tibial tendinitis, left leg: Secondary | ICD-10-CM

## 2012-05-27 DIAGNOSIS — M25572 Pain in left ankle and joints of left foot: Secondary | ICD-10-CM

## 2012-05-27 DIAGNOSIS — M76829 Posterior tibial tendinitis, unspecified leg: Secondary | ICD-10-CM

## 2012-05-27 NOTE — Assessment & Plan Note (Signed)
Clinically this is much better  Also noted that the split appears healed on [place]  Can stop NTG  Keep up exercises  Use X strap  Reck 3 mos

## 2012-05-27 NOTE — Patient Instructions (Signed)
Continue rehab exercises.  Will hold off on restarting nitroglycerin for now.  Continue x brace as tolerated.  Slow increase in mileage as tolerated.  F/u 3 months.

## 2012-05-27 NOTE — Progress Notes (Signed)
  Subjective:    Patient ID: Kyle Dunn, male    DOB: 09-Sep-1971, 41 y.o.   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 Started on NTG protocol in April. Ran out of NTG 2-3 weeks ago. Was also wearing x-brace with almost all activity. Had significant improvement with these interventions. Back to running slowly. Has had just some L ankle pain and generalized stiffness with this. Has mostly been biking and doing well with this except for some anterior knee pain related to his patellar tendon.  Runs as long as 6 miles now  Review of Systems Per HPI.    Objective:   Physical Exam L ankle: normal exam No tenderness over posterior tibialis. Normal anterior drawer. Good strength foot and ankle. R ankle: No TTP. Normal ROM and strength.  Both arches show full collapse  In clinic u/s: Previous split seen in posterior tibialis tendon at navicular resolved, still with mild thickening at this area. Rest of tendon appears well.  Much less swelling noted    Assessment & Plan:  1. Posterior tibialis tendinopathy: improving, greatly improved appearance on u/s today.  --Stop NTG at this time. --Continue X brace as needed and as tolerated. --Continue plantar flexion rehab exercises. --Increase mileage slowly as tolerated. --F/u 3 months.

## 2012-06-05 ENCOUNTER — Other Ambulatory Visit: Payer: Self-pay | Admitting: *Deleted

## 2012-06-05 MED ORDER — AMPHETAMINE-DEXTROAMPHET ER 30 MG PO CP24: 30.0000 mg | ORAL_CAPSULE | ORAL | Status: DC

## 2012-06-05 NOTE — Telephone Encounter (Signed)
Pt aware Rx will be ready for pickup in the morning.

## 2012-07-09 ENCOUNTER — Other Ambulatory Visit: Payer: Self-pay | Admitting: *Deleted

## 2012-07-09 MED ORDER — AMPHETAMINE-DEXTROAMPHET ER 30 MG PO CP24: 30.0000 mg | ORAL_CAPSULE | ORAL | Status: DC

## 2012-07-09 NOTE — Addendum Note (Signed)
Addended by: Candie Echevaria L on: 07/09/2012 03:58 PM   Modules accepted: Orders

## 2012-07-09 NOTE — Telephone Encounter (Signed)
Pt aware Rx ready for Pick up. 

## 2012-07-09 NOTE — Telephone Encounter (Signed)
Rx reprinted due to incorrect provider placed on Rx.

## 2012-08-07 ENCOUNTER — Ambulatory Visit (INDEPENDENT_AMBULATORY_CARE_PROVIDER_SITE_OTHER): Payer: BC Managed Care – PPO | Admitting: Sports Medicine

## 2012-08-07 VITALS — BP 120/70 | Ht 66.0 in | Wt 161.0 lb

## 2012-08-07 DIAGNOSIS — M6789 Other specified disorders of synovium and tendon, multiple sites: Secondary | ICD-10-CM

## 2012-08-07 DIAGNOSIS — M76829 Posterior tibial tendinitis, unspecified leg: Secondary | ICD-10-CM

## 2012-08-07 DIAGNOSIS — M79609 Pain in unspecified limb: Secondary | ICD-10-CM

## 2012-08-07 DIAGNOSIS — M79673 Pain in unspecified foot: Secondary | ICD-10-CM

## 2012-08-07 DIAGNOSIS — M76822 Posterior tibial tendinitis, left leg: Secondary | ICD-10-CM

## 2012-08-07 MED ORDER — NITROGLYCERIN 0.2 MG/HR TD PT24: MEDICATED_PATCH | TRANSDERMAL | Status: DC

## 2012-08-08 NOTE — Progress Notes (Signed)
  Subjective:    Patient ID: Kyle Dunn, male    DOB: May 28, 1971, 41 y.o.   MRN: [id number]  HPI chief complaint: Left ankle pain  Kyle Dunn is a very pleasant 41 year old who comes in today complaining of sudden onset left ankle pain. He has a well-documented history of posterior tibialis tendon insufficiency and tendinopathy treated with topical nitroglycerin patches and eccentric exercises earlier this year by Dr. Darrick Penna. Initial ultrasound back in April showed a split through the peroneal tendon which completely resolved with treatment as of July. Patient was doing very well and had return to cycling and running when he had a sudden onset of pain earlier this week which was identical in nature to the pain he experienced previously. Localizes all of his pain along the medial portion of his foot near the insertion of the  Posterior tibialis tendon at the navicular. He has been very compliant with his home exercises and has been wearing custom orthotic as well as a body helix X. Strap. The only change in his work out was the addition of some heavy resistance training. He states that he has been doing very heavy leg presses with weight at times exceeding 800 pounds. He has not experienced any discomfort with this exercise but he questions whether or not that may have contributed to his reinjury.  Interim medical history is unchanged from his previous visit    Review of Systems     Objective:   Physical Exam Well-developed, fit appearing, no acute distress. Awake alert and oriented times 3  Left foot:  He has tenderness as well her swelling along the posterior tibialis tendon just past the medial malleolus as it approaches the insertion on the navicular. Significant pes planus with standing with "too many toes sign". Significant hindfoot valgus. Good passive subtalar motion. Neurovascular intact distally. Walking without a significant limp.  MSK ultrasound of the left foot: Images were  obtained both in the longitudinal and transverse plane. He has what appears to be a new split tear of the posterior tibialis tendon just proximal to the insertion on the navicular. This splint is best seen on the transverse view. Significant neovascularity is seen. Mild edema and mild tendon thickening but not marked.        Assessment & Plan:  1. Left foot pain secondary to reoccurring posterior tibialis tendon tear 2. Posterior tibialis tendon insufficiency 3. Pes planus  Patient will restart his nitroglycerin protocol.1/4 patch daily. He will continue with his eccentric strengthening exercises. We added a medial heel wedge to his orthotics to help correct some of his hindfoot valgus. Although he does have a new split tear in his posterior tibialis tendon, he does not have a marked amount of swelling or thickening some hopeful that he will make a rather rapid recovery. He will followup with me in 4 weeks for a repeat ultrasound. If symptoms improve, he may try to return to some light running prior to his followup appointment. Call with questions or concerns in the interim.

## 2012-09-01 ENCOUNTER — Other Ambulatory Visit: Payer: Self-pay

## 2012-09-01 NOTE — Telephone Encounter (Signed)
OV 08/07/12 Adderall last filled 07/09/12 #30 no refills Plz advise    MW

## 2012-09-02 MED ORDER — AMPHETAMINE-DEXTROAMPHET ER 30 MG PO CP24: 30.0000 mg | ORAL_CAPSULE | ORAL | Status: DC

## 2012-09-02 NOTE — Telephone Encounter (Signed)
Done

## 2012-09-02 NOTE — Telephone Encounter (Signed)
Called and spoke to pt to advise Rx ready for pick up.        MW

## 2012-09-08 ENCOUNTER — Ambulatory Visit: Payer: BC Managed Care – PPO | Admitting: Sports Medicine

## 2012-09-22 ENCOUNTER — Ambulatory Visit (INDEPENDENT_AMBULATORY_CARE_PROVIDER_SITE_OTHER): Payer: BC Managed Care – PPO | Admitting: Sports Medicine

## 2012-09-22 ENCOUNTER — Encounter: Payer: Self-pay | Admitting: Sports Medicine

## 2012-09-22 VITALS — BP 139/77 | HR 99 | Ht 66.0 in | Wt 165.0 lb

## 2012-09-22 DIAGNOSIS — M76829 Posterior tibial tendinitis, unspecified leg: Secondary | ICD-10-CM

## 2012-09-22 DIAGNOSIS — M6789 Other specified disorders of synovium and tendon, multiple sites: Secondary | ICD-10-CM

## 2012-09-22 NOTE — Progress Notes (Signed)
  Subjective:    Patient ID: Kyle Dunn, male    DOB: 1971-01-19, 41 y.o.   MRN: [id number]  HPI Patient comes in today for followup on his left posterior tibialis tendon tear. He was last seen in the office on October 3. Ultrasound at that time showed a split tear through the posterior tibialis tendon near the navicular. Since his last office visit he has not been running. He feels like his symptoms are improving. His pain is now intermittent and located just distal to the medial malleolus. This is in a little different location than before. He is doing well with his orthotics and his body helix compression sleeve. He continues on the quarter patch nitroglycerin protocol although he admits that he has been somewhat irregular with its usage.    Review of Systems     Objective:   Physical Exam Well-developed, well-nourished. No acute distress.  Left ankle: Moderate pes planus with standing. Calcaneal valgus. There is pain with firing the posterior tibialis tendon I standing on his tiptoes. Calcaneus inverts to neutral. There is an absence of the too many toes sign. Subtalar joint is flexible. There is tenderness to palpation along the posterior tibialis tendon just as it passes behind the medial malleolus. No significant soft tissue swelling. No pain at the navicular. Neurovascular intact distally. Walking without a significant limp.  MSK ultrasound of the left ankle: Limited views of the posterior tibialis tendon were obtained. Both longitudinal and transverse images were obtained. There is still a split tear of the posterior tibialis tendon just distal to the medial malleolus best seen on transverse view. There is persistent neovascularity. Mild amount of peritendinous edema but improved from our last study. The tear seen close the navicular on the last study is no longer appreciated but there is tendon thickening just proximal to the insertion on the navicular.       Assessment &  Plan:  1. Left ankle pain secondary to posterior tibialis tendon dysfunction/tendinopathy/partial tendon tearing  I think the patient is improving but he still has ultrasound evidence of a partial tear of his posterior tibialis tendon. I think he should continue crosstraining for an additional 6 weeks. He is fine with that plan. Continue with topical nitroglycerin and home exercise program. Continue with orthotics and body heel is compression sleeve. We will repeat his ultrasound at his followup visit.

## 2012-10-13 ENCOUNTER — Telehealth: Payer: Self-pay | Admitting: *Deleted

## 2012-10-13 MED ORDER — AMPHETAMINE-DEXTROAMPHET ER 30 MG PO CP24: 30.0000 mg | ORAL_CAPSULE | ORAL | Status: DC

## 2012-10-13 NOTE — Telephone Encounter (Signed)
Pt aware Rx ready for pick up and 6 month F/U due now. Pt will schedule at pick up.

## 2012-10-31 ENCOUNTER — Ambulatory Visit: Payer: BC Managed Care – PPO | Admitting: Sports Medicine

## 2012-11-17 ENCOUNTER — Encounter: Payer: Self-pay | Admitting: Internal Medicine

## 2012-11-17 ENCOUNTER — Encounter: Payer: Self-pay | Admitting: Lab

## 2012-11-17 ENCOUNTER — Ambulatory Visit (INDEPENDENT_AMBULATORY_CARE_PROVIDER_SITE_OTHER): Payer: BC Managed Care – PPO | Admitting: Internal Medicine

## 2012-11-17 VITALS — BP 132/80 | HR 88 | Temp 98.0°F | Ht 67.0 in | Wt 181.0 lb

## 2012-11-17 DIAGNOSIS — F988 Other specified behavioral and emotional disorders with onset usually occurring in childhood and adolescence: Secondary | ICD-10-CM

## 2012-11-17 DIAGNOSIS — F411 Generalized anxiety disorder: Secondary | ICD-10-CM

## 2012-11-17 MED ORDER — AMPHETAMINE-DEXTROAMPHET ER 30 MG PO CP24: 30.0000 mg | ORAL_CAPSULE | ORAL | Status: DC

## 2012-11-17 NOTE — Assessment & Plan Note (Signed)
Well-controlled, refill meds. He wonders if he could use the intermediate release Adderall because the times of the day that he needs the medication a very short however he is doing so well with current medication we agreed to stay on same meds.

## 2012-11-17 NOTE — Progress Notes (Signed)
  Subjective:    Patient ID: Kyle Dunn, male    DOB: 1971/03/26, 42 y.o.   MRN: [id number]  HPI  Good compliance with Adderall, no apparent side effects. Needs refill.    Past Medical History  Diagnosis Date  . ADD (attention deficit disorder)     Past Surgical History  Procedure Date  . Knee arthroscopy     R at age 69    History   Social History  . Marital Status: Married    Spouse Name: N/A    Number of Children: 2  . Years of Education: N/A   Occupational History  . furniture  Other   Social History Main Topics  . Smoking status: Never Smoker   . Smokeless tobacco: Never Used  . Alcohol Use: Yes     Comment: socially   . Drug Use: No  . Sexually Active: Not on file   Other Topics Concern  . Not on file   Social History Narrative   re-married ---very active, exercise 1-2 hours a day     Review of Systems Denies anxiety or depression. Sleeps well. Had a number of orthopedic problems, they are improving, remains very active, goes to the gym daily.    Objective:   Physical Exam General -- alert, well-developed, and well-nourished.   Lungs -- normal respiratory effort, no intercostal retractions, no accessory muscle use, and normal breath sounds.   Heart-- normal rate, regular rhythm, no murmur, and no gallop.   Abdomen--soft, non-tender, no distention, no masses, no HSM, no guarding, and no rigidity.   Extremities-- no pretibial edema bilaterally  Psych-- Cognition and judgment appear intact. Alert and cooperative with normal attention span and concentration.  not anxious appearing and not depressed appearing.       Assessment & Plan:

## 2012-11-17 NOTE — Assessment & Plan Note (Signed)
Not an issues

## 2012-11-17 NOTE — Patient Instructions (Addendum)
Come back for a physical as planned by 01-2013

## 2012-12-15 ENCOUNTER — Other Ambulatory Visit: Payer: Self-pay | Admitting: *Deleted

## 2012-12-15 MED ORDER — AMPHETAMINE-DEXTROAMPHET ER 30 MG PO CP24: 30.0000 mg | ORAL_CAPSULE | ORAL | Status: DC

## 2012-12-15 NOTE — Telephone Encounter (Signed)
Rx ready for pick up., Pt aware. 

## 2012-12-24 ENCOUNTER — Telehealth: Payer: Self-pay | Admitting: *Deleted

## 2012-12-24 NOTE — Telephone Encounter (Signed)
Prior auth for adderall xr 30mg  has been approved.

## 2013-01-01 ENCOUNTER — Encounter: Payer: Self-pay | Admitting: Internal Medicine

## 2013-01-22 ENCOUNTER — Telehealth: Payer: Self-pay | Admitting: *Deleted

## 2013-01-22 NOTE — Telephone Encounter (Signed)
Patient left message on triage requesting refill for adderall. Last OV 11/17/12 with last refill on 12/15/12 # 30. Pt had low risk UDS. Okay to refill?

## 2013-01-22 NOTE — Telephone Encounter (Signed)
Ok to refill? Last OV 1.13.14 Last filled 2.10.14

## 2013-01-23 MED ORDER — AMPHETAMINE-DEXTROAMPHET ER 30 MG PO CP24: 30.0000 mg | ORAL_CAPSULE | ORAL | Status: DC

## 2013-01-23 NOTE — Telephone Encounter (Signed)
Yes, done.  

## 2013-01-23 NOTE — Telephone Encounter (Signed)
Pt made aware rx is ready to be picked up at front desk.  

## 2013-01-26 ENCOUNTER — Encounter: Payer: BC Managed Care – PPO | Admitting: Internal Medicine

## 2013-02-24 ENCOUNTER — Telehealth: Payer: Self-pay | Admitting: General Practice

## 2013-02-24 MED ORDER — AMPHETAMINE-DEXTROAMPHET ER 30 MG PO CP24: 30.0000 mg | ORAL_CAPSULE | ORAL | Status: DC

## 2013-02-24 NOTE — Telephone Encounter (Signed)
Pt made aware rx is ready to be picked up at front desk.  

## 2013-02-24 NOTE — Telephone Encounter (Signed)
done

## 2013-02-24 NOTE — Telephone Encounter (Signed)
Pt called stating that he would like his Adderall XR 30mg  refilled. Med last filled on 01/23/13. Please advise pt when this medication is ready for pick up. [phone].

## 2013-04-01 ENCOUNTER — Telehealth: Payer: Self-pay | Admitting: General Practice

## 2013-04-01 MED ORDER — AMPHETAMINE-DEXTROAMPHET ER 30 MG PO CP24: 30.0000 mg | ORAL_CAPSULE | ORAL | Status: DC

## 2013-04-01 NOTE — Telephone Encounter (Signed)
Pt made aware rx is ready to be picked up at front desk.  

## 2013-04-01 NOTE — Telephone Encounter (Signed)
Adderal Refill Last OV 11-17-12 Med filled 02-24-13 #30 0 refills.

## 2013-04-01 NOTE — Telephone Encounter (Signed)
Done. Had low risk UDS

## 2013-05-06 ENCOUNTER — Telehealth: Payer: Self-pay | Admitting: *Deleted

## 2013-05-06 MED ORDER — AMPHETAMINE-DEXTROAMPHET ER 30 MG PO CP24: 30.0000 mg | ORAL_CAPSULE | ORAL | Status: DC

## 2013-05-06 NOTE — Telephone Encounter (Signed)
Ok for #30 

## 2013-05-06 NOTE — Telephone Encounter (Signed)
Refill done per orders. Pt. Contacted via telephone and made aware rx ready for pickup at front desk.

## 2013-05-06 NOTE — Telephone Encounter (Signed)
Pt. Called requesting refill on this Adderall. Last office visit 1.13.14, last filled by Dr. Drue Novel 5.28.14. #30 No refills. Ok to refill?

## 2013-06-25 ENCOUNTER — Telehealth: Payer: Self-pay

## 2013-06-25 MED ORDER — AMPHETAMINE-DEXTROAMPHET ER 30 MG PO CP24: 30.0000 mg | ORAL_CAPSULE | ORAL | Status: DC

## 2013-06-25 NOTE — Telephone Encounter (Signed)
Pt. Made aware of need to pick up rx and front desk made aware of need for UDS. Dr. Drue Novel had already printed and rx for this patient, second copy placed in shred bin per protocol.

## 2013-06-25 NOTE — Telephone Encounter (Signed)
Okay to refill, needs UDS

## 2013-06-25 NOTE — Telephone Encounter (Signed)
Patient left message on triage voicemail requesting refill on Adderall. 1.) Last OV 11/17/12 2.) Last fill date 05/06/13 #30 3.)Last UDS 11/17/12, DUE for UDS when picking up this RX  Please advise if ok to fill

## 2013-07-27 ENCOUNTER — Ambulatory Visit (INDEPENDENT_AMBULATORY_CARE_PROVIDER_SITE_OTHER): Payer: BC Managed Care – PPO | Admitting: Family Medicine

## 2013-07-27 ENCOUNTER — Encounter: Payer: Self-pay | Admitting: Family Medicine

## 2013-07-27 VITALS — BP 145/95 | HR 96 | Ht 67.0 in | Wt 175.0 lb

## 2013-07-27 DIAGNOSIS — M79609 Pain in unspecified limb: Secondary | ICD-10-CM

## 2013-07-27 DIAGNOSIS — M79669 Pain in unspecified lower leg: Secondary | ICD-10-CM

## 2013-07-28 ENCOUNTER — Encounter: Payer: Self-pay | Admitting: Family Medicine

## 2013-07-28 NOTE — Progress Notes (Signed)
  Subjective:    Patient ID: Kyle Dunn, male    DOB: June 20, 1971, 42 y.o.   MRN: [id number]  HPI Bilateral anterior shin pain notable for the last week to week and a half. Comes on when he is running. If he is running hills. Resolves immediately when he stops. On one occasion he was having the pain so he switched doing stairs and the pain immediately subsided that he went back to around feels the pain returned. He is also having it cycling. Notably he has started back working out much more aggressively than in the last few months. He plans to do a tough motor in the next month. He is worried he has compartment syndrome. He has been wearing his orthotics. He has had about a 40% increase in his amount of exercise per week both cycling, running, strength training.   Review of Systems Denies fever, sweats, chills, unusual weight change. No numbness in the feet. No color change in the feet.    Objective:   Physical Exam Vital signs are reviewed GENERAL: Well-developed male no acute distress LOWER extremity: Bilaterally the anterior shin area is normal in appearance without any erythema, no ecchymoses, no defect. It is nontender to palpation and percussion. Dorsiflexion and plantar flexion bilaterally is 5 out of 5 and painless. Distally he has intact sensation to soft touch in his dorsalis pedis pulses are 2+ bilaterally equal. GAIT: Running. He is a lateral midfoot Striker. Most of the forces on the middle portion of the foot laterally H. transmitting directly up to the anterior tibialis. He has a little bit of a short stride, possibly because he is having little pain when he is running today. After the run his anterior shins or again nontender.       Assessment & Plan:  Anterior shin pain. I think this is due to to his rapid increase in amount of training. He has well-made orthotics I would continue to use those. I would avoid running up and down hills particularly when he is not warmed  up. We discussed dorsiflexion exercises with the goal of 50 reps at a time on each foot maximum goal of 200 per day. This will be to strengthen anterior tibial muscles. We talked about a more reasonable approach to increase in volume  of his training. Also recommend ice after every exercise period.. 20 minutes bilateral anterior shins. If he is not improving or if he has worsening symptoms he'll return to clinic. I reassured him I do not think this is compartment syndrome particularly as he had immediate relief when he stopped running and start doing stairs. Greater than 50% of our 45 minute office visit was spent in counseling and education regarding these issues.

## 2013-08-04 ENCOUNTER — Other Ambulatory Visit: Payer: Self-pay | Admitting: General Practice

## 2013-08-04 NOTE — Telephone Encounter (Signed)
amphetamine-dextroamphetamine (ADDERALL XR) 30 MG 24 hr capsule Last OV 11-17-12 Med filled 06-25-13 #30 with 0 refills   Low Risk

## 2013-08-06 MED ORDER — AMPHETAMINE-DEXTROAMPHET ER 30 MG PO CP24: 30.0000 mg | ORAL_CAPSULE | ORAL | Status: DC

## 2013-08-06 NOTE — Telephone Encounter (Signed)
Call patient: Prescription ready Needs to schedule a routine visit,needs to be seen every 6 months if we provide this prescription.

## 2013-09-18 ENCOUNTER — Encounter: Payer: Self-pay | Admitting: Internal Medicine

## 2013-09-18 ENCOUNTER — Ambulatory Visit (INDEPENDENT_AMBULATORY_CARE_PROVIDER_SITE_OTHER): Payer: BC Managed Care – PPO | Admitting: Internal Medicine

## 2013-09-18 VITALS — BP 142/72 | HR 78 | Temp 97.5°F | Wt 177.0 lb

## 2013-09-18 DIAGNOSIS — F988 Other specified behavioral and emotional disorders with onset usually occurring in childhood and adolescence: Secondary | ICD-10-CM

## 2013-09-18 MED ORDER — AMPHETAMINE-DEXTROAMPHET ER 30 MG PO CP24: 30.0000 mg | ORAL_CAPSULE | ORAL | Status: DC

## 2013-09-18 NOTE — Assessment & Plan Note (Signed)
Under excellent control, refill provide, UDS today

## 2013-09-18 NOTE — Patient Instructions (Signed)
Stop by the labs today to get a UDS  Schedule labs for next week: FLP AST ALT-- dx dyslipidemia  Next visit in 6 months  for a physical exam. Fasting Please make an appointment

## 2013-09-18 NOTE — Progress Notes (Signed)
  Subjective:    Patient ID: Kyle Dunn, male    DOB: February 22, 1971, 42 y.o.   MRN: [id number]  HPI Routine office visit ADHD--good medication compliance, no apparent side effects. Flu shot ? Declined His last lab were > 1 year ago, LDL was slightly high    Past Medical History  Diagnosis Date  . ADD (attention deficit disorder)    Past Surgical History  Procedure Laterality Date  . Knee arthroscopy      R at age 40   History   Social History  . Marital Status: Married    Spouse Name: N/A    Number of Children: 2  . Years of Education: N/A   Occupational History  . furniture  Other   Social History Main Topics  . Smoking status: Never Smoker   . Smokeless tobacco: Never Used  . Alcohol Use: Yes     Comment: socially   . Drug Use: No  . Sexual Activity: Not on file   Other Topics Concern  . Not on file   Social History Narrative   re-married           Review of Systems Denies anxiety, palpitations, insomnia. He is extremely active, no problems running.     Objective:   Physical Exam  BP 142/72  Pulse 78  Temp(Src) 97.5 F (36.4 C)  Wt 177 lb (80.287 kg)  SpO2 97% General -- alert, well-developed, NAD.  Lungs -- normal respiratory effort, no intercostal retractions, no accessory muscle use, and normal breath sounds.  Heart-- normal rate, regular rhythm, no murmur.  Extremities-- no pretibial edema bilaterally  Neurologic--  alert & oriented X3. Speech normal, gait normal, strength normal in all extremities.  Psych-- Cognition and judgment appear intact. Cooperative with normal attention span and concentration. No anxious appearing , no depressed appearing.     Assessment & Plan:   Mild dyslipidemia -- has a excellent lifestyle, will check FLP, ALT AST. No history of heart disease in his family.

## 2013-09-18 NOTE — Progress Notes (Signed)
Pre visit review using our clinic review tool, if applicable. No additional management support is needed unless otherwise documented below in the visit note. 

## 2013-09-22 ENCOUNTER — Other Ambulatory Visit: Payer: BC Managed Care – PPO

## 2013-09-29 ENCOUNTER — Telehealth: Payer: Self-pay | Admitting: *Deleted

## 2013-09-29 NOTE — Telephone Encounter (Signed)
UDS on 09/18/13 deemed Low Risk by provider

## 2013-10-07 ENCOUNTER — Encounter: Payer: Self-pay | Admitting: Internal Medicine

## 2013-10-21 ENCOUNTER — Telehealth: Payer: Self-pay | Admitting: *Deleted

## 2013-10-21 MED ORDER — AMPHETAMINE-DEXTROAMPHET ER 30 MG PO CP24: 30.0000 mg | ORAL_CAPSULE | ORAL | Status: DC

## 2013-10-21 NOTE — Telephone Encounter (Signed)
Patient is requesting refill on Adderall  Last seen-09/18/2013  Last filled-09/18/2013  UDS-06/30/2013 low risk, contract signed   Please advise. SW

## 2013-10-21 NOTE — Telephone Encounter (Signed)
rx printed

## 2013-11-17 ENCOUNTER — Encounter: Payer: Self-pay | Admitting: Internal Medicine

## 2013-12-02 ENCOUNTER — Ambulatory Visit (INDEPENDENT_AMBULATORY_CARE_PROVIDER_SITE_OTHER): Payer: BC Managed Care – PPO | Admitting: Internal Medicine

## 2013-12-02 ENCOUNTER — Encounter: Payer: Self-pay | Admitting: Internal Medicine

## 2013-12-02 VITALS — BP 121/70 | HR 87 | Temp 97.8°F | Wt 185.0 lb

## 2013-12-02 DIAGNOSIS — H6690 Otitis media, unspecified, unspecified ear: Secondary | ICD-10-CM

## 2013-12-02 DIAGNOSIS — J209 Acute bronchitis, unspecified: Secondary | ICD-10-CM

## 2013-12-02 MED ORDER — AMPHETAMINE-DEXTROAMPHET ER 30 MG PO CP24: 30.0000 mg | ORAL_CAPSULE | ORAL | Status: DC

## 2013-12-02 MED ORDER — HYDROCODONE-HOMATROPINE 5-1.5 MG/5ML PO SYRP: 5.0000 mL | ORAL_SOLUTION | Freq: Every evening | ORAL | Status: DC | PRN

## 2013-12-02 MED ORDER — AMOXICILLIN 500 MG PO CAPS: 1000.0000 mg | ORAL_CAPSULE | Freq: Two times a day (BID) | ORAL | Status: DC

## 2013-12-02 NOTE — Progress Notes (Signed)
   Subjective:    Patient ID: Kyle Dunn, male    DOB: Oct 28, 1971, 43 y.o.   MRN: [id number]  HPI Acute visit  Sx started 1 week ago, Sinus pressure, chest congestion, cough mostly dry, right ear pain, severe fatigue. Some shortness of breath as well. Not taking anything specific for his symptoms. He is a frequent flyer.  Past Medical History  Diagnosis Date  . ADD (attention deficit disorder)    Past Surgical History  Procedure Laterality Date  . Knee arthroscopy      R at age 43    Review of Systems + Subjective fever Denies nausea, vomiting, diarrhea or myalgias. Denies wheezing or chest pain per se    Objective:   Physical Exam BP 121/70  Pulse 87  Temp(Src) 97.8 F (36.6 C)  Wt 185 lb (83.915 kg)  SpO2 97% General -- alert, well-developed, NAD.  HEENT-- Not pale.  TMs  R-- bulge , red, no d/c L normal throat symmetric, no redness or discharge. Face symmetric, sinuses not tender to palpation. Nose quite congested. Lungs -- normal respiratory effort, no intercostal retractions, no accessory muscle use, and normal breath sounds.  Heart-- normal rate, regular rhythm, no murmur.  Extremities-- no pretibial edema bilaterally , calves symmetric Neurologic--  alert & oriented X3. Speech normal, gait normal, strength normal in all extremities.   Psych-- Cognition and judgment appear intact. Cooperative with normal attention span and concentration. No anxious or depressed appearing.      Assessment & Plan:  Right otitis media, bronchitis. Symptoms consistent with above dx, see instructions

## 2013-12-02 NOTE — Patient Instructions (Signed)
Rest, fluids  For pain take motrin or tylenol For cough, take Mucinex DM twice a day as needed  If the cough is severe, try hydrocodone at night, will make you sleepy  For congestion use OTC Nasocort: 2 nasal sprays on each side of the nose daily until you feel better  Take the antibiotic as prescribed  (Amoxicillin) Call if no better in few days Call anytime if the symptoms are severe

## 2013-12-02 NOTE — Progress Notes (Signed)
Pre visit review using our clinic review tool, if applicable. No additional management support is needed unless otherwise documented below in the visit note. 

## 2014-01-15 ENCOUNTER — Telehealth: Payer: Self-pay | Admitting: Internal Medicine

## 2014-01-15 MED ORDER — AMPHETAMINE-DEXTROAMPHET ER 30 MG PO CP24: 30.0000 mg | ORAL_CAPSULE | ORAL | Status: DC

## 2014-01-15 NOTE — Addendum Note (Signed)
Addended by: Willow OraPAZ, Carolann Brazell E on: 01/15/2014 01:22 PM   Modules accepted: Orders

## 2014-01-15 NOTE — Telephone Encounter (Addendum)
Done Rx

## 2014-01-15 NOTE — Telephone Encounter (Signed)
rx rdy for pick up- front desk

## 2014-01-15 NOTE — Telephone Encounter (Signed)
Patient called last week and earlier this week to request rx for Adderall 30 mg xr. Call [phone] when ready for pick up.

## 2014-01-15 NOTE — Telephone Encounter (Signed)
rx refill- adderall 30mg  Last OV- 12/02/13  Last refilled - 12/02/13 #30 / 0 rf  Last UDS- 09/29/13 LOW risk

## 2014-01-15 NOTE — Telephone Encounter (Signed)
LMOVM

## 2014-01-18 ENCOUNTER — Telehealth: Payer: Self-pay | Admitting: Internal Medicine

## 2014-01-18 NOTE — Telephone Encounter (Signed)
Patient called and stated that he needs prior authorization for amphetamine-dextroamphetamine (ADDERALL XR) 30 MG 24 hr capsule. His pharmacy did send a prior authorization over for it. Also patient stated he is going out of town today for a week and wanted to see if we could expedite that for him. Please advise

## 2014-01-18 NOTE — Telephone Encounter (Signed)
Prior authorization for Adderall XR 30mg  approved from 12/19/2013-01/17/2017. JG//CMA

## 2014-04-01 ENCOUNTER — Telehealth: Payer: Self-pay | Admitting: Internal Medicine

## 2014-04-01 MED ORDER — AMPHETAMINE-DEXTROAMPHET ER 30 MG PO CP24
30.0000 mg | ORAL_CAPSULE | ORAL | Status: DC
Start: 2014-04-01 — End: 2014-04-01

## 2014-04-01 MED ORDER — AMPHETAMINE-DEXTROAMPHET ER 30 MG PO CP24: 30.0000 mg | ORAL_CAPSULE | ORAL | Status: DC

## 2014-04-01 NOTE — Telephone Encounter (Signed)
Patient is requesting a refill on Adderall Last office visit  12/02/13 Last filled 01/15/14 #30 (Rx given for April 2105) UDS low risk Okay to refill ?

## 2014-04-01 NOTE — Telephone Encounter (Signed)
Caller name: Eulojio  Call back number:[phone] Pharmacy: [facility]  Reason for call:  Pt wants refill on RX amphetamine-dextroamphetamine (ADDERALL XR) 30 MG 24 hr capsule

## 2014-04-01 NOTE — Telephone Encounter (Signed)
Done , 2 Rx

## 2014-04-01 NOTE — Telephone Encounter (Signed)
lmovm

## 2014-04-02 NOTE — Telephone Encounter (Signed)
Left message on voice mail theta Rx was ready to be picked up

## 2014-06-22 ENCOUNTER — Telehealth: Payer: Self-pay

## 2014-06-22 MED ORDER — AMPHETAMINE-DEXTROAMPHET ER 30 MG PO CP24: 30.0000 mg | ORAL_CAPSULE | ORAL | Status: DC

## 2014-06-22 NOTE — Telephone Encounter (Signed)
AMPHETAMINE-DEXTROAMPHETAMINE Adderall 30mg  Last OV- 12/02/13 Last refilled- 04/01/14 # 30 / 0 rf  UDS- 09/18/13 LOW risk

## 2014-06-22 NOTE — Telephone Encounter (Signed)
Caller name:Rayen Relation to pt: Call back number:[phone] Pharmacy:  Reason for call: Minerva Areolaric called to say he needs a prescription for  AMPHETAMINE-DEXTROAMPHETAMINE Adderall  Call him when ready to pick up

## 2014-06-22 NOTE — Telephone Encounter (Signed)
Called Pt to let him know medication has been placed at the front desk for pick up

## 2014-06-22 NOTE — Telephone Encounter (Signed)
Done , please schedule a OV , need to see pt on this med every 6 months

## 2014-07-16 ENCOUNTER — Ambulatory Visit: Payer: BC Managed Care – PPO | Admitting: Internal Medicine

## 2014-07-30 ENCOUNTER — Encounter: Payer: Self-pay | Admitting: Internal Medicine

## 2014-07-30 ENCOUNTER — Ambulatory Visit (INDEPENDENT_AMBULATORY_CARE_PROVIDER_SITE_OTHER): Payer: BC Managed Care – PPO | Admitting: Internal Medicine

## 2014-07-30 VITALS — BP 128/76 | HR 77 | Temp 97.8°F | Wt 176.5 lb

## 2014-07-30 DIAGNOSIS — F988 Other specified behavioral and emotional disorders with onset usually occurring in childhood and adolescence: Secondary | ICD-10-CM

## 2014-07-30 MED ORDER — AMPHETAMINE-DEXTROAMPHET ER 20 MG PO CP24: 20.0000 mg | ORAL_CAPSULE | ORAL | Status: DC

## 2014-07-30 NOTE — Progress Notes (Signed)
   Subjective:    Patient ID: Kyle Dunn, male    DOB: 11-12-1970, 43 y.o.   MRN: [id number]  DOS:  07/30/2014 Type of visit - description : rov Interval history: Routine checkup for ADHD. Good compliance w/ medication, no apparent side effects. The patient has noted that after taking the medication he is not getting the same very crisp- focus feeling that he used to have with the medication however is doing great his work. On looking back, when he needed to medication the most he was under a lot of stress.  ROS Denies anxiety, depression. Sleeping without problems  Past Medical History  Diagnosis Date  . ADD (attention deficit disorder)     Past Surgical History  Procedure Laterality Date  . Knee arthroscopy      R at age 13    History   Social History  . Marital Status: Married    Spouse Name: N/A    Number of Children: 2  . Years of Education: N/A   Occupational History  . furniture  Other   Social History Main Topics  . Smoking status: Never Smoker   . Smokeless tobacco: Never Used  . Alcohol Use: Yes     Comment: socially   . Drug Use: No  . Sexual Activity: Not on file   Other Topics Concern  . Not on file   Social History Narrative   re-married               Medication List       This list is accurate as of: 07/30/14 11:59 PM.  Always use your most recent med list.               amphetamine-dextroamphetamine 20 MG 24 hr capsule  Commonly known as:  ADDERALL XR  Take 1 capsule (20 mg total) by mouth every morning.           Objective:   Physical Exam BP 128/76  Pulse 77  Temp(Src) 97.8 F (36.6 C) (Oral)  Wt 176 lb 8 oz (80.06 kg)  SpO2 97% General -- alert, well-developed, NAD.  Neurologic--  alert & oriented X3. Speech normal, gait appropriate for age, strength symmetric and appropriate for age.   Psych-- Cognition and judgment appear intact. Cooperative with normal attention span and concentration. No anxious or  depressed appearing.      Assessment & Plan:

## 2014-07-30 NOTE — Assessment & Plan Note (Signed)
Long history of ADHD, symptoms are well controlled, currently does not feel the same crisp/focused effect that he once had with the medication however his ADD symptoms are under good control. On looking back, ADD symptoms were quite noticeable when he was under a lot of stress which he is not now. Based on that we agreed to decrease to dose to 20 mg daily and see how he is doing. Get a UDS; f/u 2 months for a physical exam

## 2014-07-30 NOTE — Progress Notes (Signed)
Pre visit review using our clinic review tool, if applicable. No additional management support is needed unless otherwise documented below in the visit note. 

## 2014-07-30 NOTE — Patient Instructions (Signed)
Go to the lab before you leave for a UDS  Please come back to the office in 2 months  for a physical exam. Come back fasting

## 2014-08-06 ENCOUNTER — Telehealth: Payer: Self-pay

## 2014-08-06 NOTE — Telephone Encounter (Signed)
UDS 07/30/2014  Positive for Adderall.  Low risk per Dr. Drue NovelPaz 08/06/2014

## 2014-08-12 ENCOUNTER — Encounter: Payer: Self-pay | Admitting: Internal Medicine

## 2014-09-07 ENCOUNTER — Telehealth: Payer: Self-pay | Admitting: *Deleted

## 2014-09-07 NOTE — Telephone Encounter (Signed)
PA intiated for Adderall XR. Awaiting determination. JG//CMA

## 2014-09-07 NOTE — Telephone Encounter (Signed)
PA approved. Case number [id number]. JG//CMA

## 2014-10-11 ENCOUNTER — Telehealth: Payer: Self-pay | Admitting: Internal Medicine

## 2014-10-14 ENCOUNTER — Ambulatory Visit (INDEPENDENT_AMBULATORY_CARE_PROVIDER_SITE_OTHER): Payer: BC Managed Care – PPO | Admitting: Medical

## 2014-10-14 VITALS — BP 151/94 | HR 77 | Temp 98.1°F | Wt 185.4 lb

## 2014-10-14 DIAGNOSIS — J209 Acute bronchitis, unspecified: Secondary | ICD-10-CM

## 2014-10-14 MED ORDER — ALBUTEROL SULFATE HFA 108 (90 BASE) MCG/ACT IN AERS: 2.0000 | INHALATION_SPRAY | Freq: Four times a day (QID) | RESPIRATORY_TRACT | Status: DC | PRN

## 2014-10-14 MED ORDER — BECLOMETHASONE DIPROPIONATE 40 MCG/ACT IN AERS: 2.0000 | INHALATION_SPRAY | Freq: Two times a day (BID) | RESPIRATORY_TRACT | Status: DC

## 2014-10-14 MED ORDER — BENZONATATE 200 MG PO CAPS: 200.0000 mg | ORAL_CAPSULE | Freq: Three times a day (TID) | ORAL | Status: DC | PRN

## 2014-10-14 MED ORDER — AMOXICILLIN-POT CLAVULANATE 875-125 MG PO TABS: 1.0000 | ORAL_TABLET | Freq: Two times a day (BID) | ORAL | Status: DC

## 2014-10-14 MED ORDER — AMPHETAMINE-DEXTROAMPHET ER 20 MG PO CP24: 20.0000 mg | ORAL_CAPSULE | ORAL | Status: DC

## 2014-10-14 NOTE — Progress Notes (Signed)
Pre visit review using our clinic review tool, if applicable. No additional management support is needed unless otherwise documented below in the visit note. 

## 2014-10-14 NOTE — Assessment & Plan Note (Signed)
Pt mentioned at very end his wanted refills of the adderall xr. Pt has ADD. Discussed with Dr. Drue NovelPaz. Will attempt to fill xr. If not covered then we will give him standard form.

## 2014-10-14 NOTE — Patient Instructions (Addendum)
You appear to have bronchitis with lt om. Rest hydrate and tylenol for fever. I am prescribing  benzonatate cough medicine, and augmentin antibiotic. For your nasal congestion you could use otc the counter nasal steroid.   For our possible reactive airway disease, I will prescribe albuterol inhaler. If you have to use albuterol frequently then start qvar.  You should gradually get better. If not then notify us and would recommend a chest xray.  Follow up in 7-10 days or as needed    .

## 2014-10-14 NOTE — Progress Notes (Signed)
Subjective:    Patient ID: Kyle Dunn, male    DOB: 05-06-1971, 43 y.o.   MRN: [id number]  HPI   Pt in today reporting  cough, nasal congestion, chest congestion  and runny nose for  5 Days.  He feels deep chest congestion like he needs to cough up mucuous. If he cough will bring up mucous.Some fever and chills yesterday.  Associated symptoms( below yes or no)  Fever- subjective  Chills-Yes Chest congestion-yes. Sneezing- yes. Itching eyes-some Sore throat- NO Post-nasal drainage-yes. Wheezing-no. But feels like some constriction. Purulent nasal  drainage-no Fatigue-moderate.    Past Medical History  Diagnosis Date  . ADD (attention deficit disorder)     History   Social History  . Marital Status: Married    Spouse Name: N/A    Number of Children: 2  . Years of Education: N/A   Occupational History  . furniture  Other   Social History Main Topics  . Smoking status: Never Smoker   . Smokeless tobacco: Never Used  . Alcohol Use: Yes     Comment: socially   . Drug Use: No  . Sexual Activity: Not on file   Other Topics Concern  . Not on file   Social History Narrative   re-married           Past Surgical History  Procedure Laterality Date  . Knee arthroscopy      R at age 43    Family History  Problem Relation Age of Onset  . CAD Neg Hx   . Diabetes Neg Hx   . Colon cancer Father     dx in his 270s  . Prostate cancer Neg Hx   . Alcohol abuse Father     No Known Allergies  No current outpatient prescriptions on file prior to visit.   No current facility-administered medications on file prior to visit.    BP 151/94 mmHg  Pulse 77  Temp(Src) 98.1 F (36.7 C) (Oral)  Wt 185 lb 6.4 oz (84.097 kg)  SpO2 97%        Review of Systems  Constitutional: Negative for fever, chills and fatigue.  HENT: Positive for congestion, postnasal drip, sinus pressure and sore throat.   Respiratory: Positive for cough. Negative for  wheezing.        Feels some shortness of breath easier than usual.  Cardiovascular: Negative for chest pain and palpitations.  Musculoskeletal: Negative for neck pain.       No leg pain./popliteal pain.  Neurological: Negative for dizziness and headaches.  Hematological: Negative for adenopathy. Does not bruise/bleed easily.       Objective:   Physical Exam   General  Mental Status - Alert. General Appearance - Well groomed. Not in acute distress.  Skin Rashes- No Rashes.  HEENT Head- Normal. Ear Auditory Canal - Left- Normal. Right - Normal.Tympanic Membrane- Left- Normal. Right- Normal. Eye Sclera/Conjunctiva- Left- Normal. Right- Normal. Nose & Sinuses Nasal Mucosa- Left-  Not oggy or Congested. Right-  Not  boggy or Congested. Mouth & Throat Lips: Upper Lip- Normal: no dryness, cracking, pallor, cyanosis, or vesicular eruption. Lower Lip-Normal: no dryness, cracking, pallor, cyanosis or vesicular eruption. Buccal Mucosa- Bilateral- No Aphthous ulcers. Oropharynx- No Discharge or Erythema. Tonsils: Characteristics- Bilateral- No Erythema or Congestion. Size/Enlargement- Bilateral- No enlargement. Discharge- bilateral-None.  Neck Neck- Supple. No Masses.   Chest and Lung Exam Auscultation: Breath Sounds:- even and unlabored. Very faint minimal rhonchi upper lobes.  Cardiovascular Auscultation:Rythm- Regular,  rate and rhythm. Murmurs & Other Heart Sounds:Ausculatation of the heart reveal- No Murmurs.  Lymphatic Head & Neck General Head & Neck Lymphatics: Bilateral: Description- No Localized lymphadenopathy.        Assessment & Plan:

## 2014-10-14 NOTE — Assessment & Plan Note (Signed)
You appear to have bronchitis with lt om. Rest hydrate and tylenol for fever. I am prescribing  benzonatate cough medicine, and augmentin antibiotic. For your nasal congestion you could use otc the counter nasal steroid.   For our possible reactive airway disease, I will prescribe albuterol inhaler. If you have to use albuterol frequently then start qvar.  You should gradually get better. If not then notify us and would recommend a chest xray.

## 2014-11-16 ENCOUNTER — Telehealth: Payer: Self-pay | Admitting: Internal Medicine

## 2014-11-16 NOTE — Telephone Encounter (Signed)
Caller name:Aurther Martes Relation to ZO:XWRUpt:self Call back number:[phone] Pharmacy:  Reason for call: pt is needing rx amphetamine-dextroamphetamine (ADDERALL XR) 20 MG 24 hr capsule please call when available for pick up.

## 2014-11-19 MED ORDER — AMPHETAMINE-DEXTROAMPHET ER 20 MG PO CP24
20.0000 mg | ORAL_CAPSULE | ORAL | Status: DC
Start: 2014-11-19 — End: 2015-02-11

## 2014-11-19 MED ORDER — AMPHETAMINE-DEXTROAMPHET ER 20 MG PO CP24: 20.0000 mg | ORAL_CAPSULE | ORAL | Status: DC

## 2014-11-19 NOTE — Telephone Encounter (Signed)
Pt is requesting refill on Adderall. Pt called on 11/16/14 requesting refill but I did not receive phone note, he called back today (11/19/14) requesting this be done today.   Last OV: 07/30/2014 Last Fill: 10/14/2014 # 30 0RF UDS: 07/30/2014 Low risk  Please advise.

## 2014-11-19 NOTE — Telephone Encounter (Signed)
Patient calling in regarding this. He is requesting this to be done today. Best # [phone]

## 2014-11-19 NOTE — Telephone Encounter (Signed)
Print 2 Rx for January and February

## 2014-11-19 NOTE — Telephone Encounter (Signed)
LMOM informing Pt that rx is ready for pick up at front desk.  

## 2014-11-19 NOTE — Telephone Encounter (Signed)
Printed x2, awaiting signature by Dr. Drue NovelPaz.

## 2015-02-10 ENCOUNTER — Telehealth: Payer: Self-pay | Admitting: Internal Medicine

## 2015-02-10 NOTE — Telephone Encounter (Signed)
Caller name:Umbarger, Ellington Relation to WU:JWJXpt:self Call back number:[phone] Pharmacy:  Reason for call: pt is needing rx amphetamine-dextroamphetamine (ADDERALL XR) 20 MG 24 hr capsule , please call when available for pick up

## 2015-02-11 ENCOUNTER — Ambulatory Visit (INDEPENDENT_AMBULATORY_CARE_PROVIDER_SITE_OTHER): Payer: BLUE CROSS/BLUE SHIELD | Admitting: Family Medicine

## 2015-02-11 ENCOUNTER — Encounter (INDEPENDENT_AMBULATORY_CARE_PROVIDER_SITE_OTHER): Payer: Self-pay

## 2015-02-11 ENCOUNTER — Encounter: Payer: Self-pay | Admitting: Family Medicine

## 2015-02-11 VITALS — BP 150/108 | HR 92 | Ht 66.0 in | Wt 170.0 lb

## 2015-02-11 DIAGNOSIS — M7651 Patellar tendinitis, right knee: Secondary | ICD-10-CM

## 2015-02-11 MED ORDER — NITROGLYCERIN 0.2 MG/HR TD PT24: MEDICATED_PATCH | TRANSDERMAL | Status: DC

## 2015-02-11 MED ORDER — AMPHETAMINE-DEXTROAMPHET ER 20 MG PO CP24
20.0000 mg | ORAL_CAPSULE | ORAL | Status: DC
Start: 2015-02-11 — End: 2015-02-21

## 2015-02-11 NOTE — Telephone Encounter (Signed)
Rx printed, awaiting signature by Dr. Blyth.  

## 2015-02-11 NOTE — Telephone Encounter (Signed)
It looks to me like Dr Drue NovelPaz wanted to see him back because he had changed doses of med. Notify patient he needs to schedule a follow up with Dr Drue NovelPaz. Am will to prescribe him #10 Adderall til PMD returns

## 2015-02-11 NOTE — Telephone Encounter (Signed)
Tried calling Pt to let Pt know Rx is ready for pick up. Pt's mailbox is full, Rx placed at front desk for pick up. Note attached informing due for appt.

## 2015-02-11 NOTE — Telephone Encounter (Signed)
Pt is requesting refill on Adderall:  Last OV: 07/30/2014, per AVS was to return in 2 months. Last Fill: 11/19/2014 # 30 0RF UDS: 07/30/2014 Low risk  Please advise.

## 2015-02-11 NOTE — Patient Instructions (Addendum)
You have patellar tendinitis. Ibuprofen or aleve only if needed for pain. Icing 15 minutes at a time 3-4 times a day Straight leg raises, knee extensions, and squat on a hill 3 sets of 10 once a day for next 6 weeks. Patellar strap when up and walking around and definitely with exercise for next 6 weeks. Nitro patches 1/4th patch over affected tendon, change daily. Consider physical therapy if not improving in the future. Follow up with me in 6 weeks. Activities as tolerated otherwise though when possible try to avoid a lot of hills, stairs, jumping, sports like basketball that involve a lot of jumping.

## 2015-02-15 DIAGNOSIS — M7651 Patellar tendinitis, right knee: Secondary | ICD-10-CM | POA: Insufficient documentation

## 2015-02-15 NOTE — Assessment & Plan Note (Signed)
ultrasound showed some disorganization of tendon proximally and a small calcification.  One hypoechoic area is only about 5% of the tendon.  Shown home exercises to do daily.  Icing, nsaids as needed.  Patellar strap.  Nitro patches - discussed risks of headache, skin irritation.  F/u in 6 weeks.  Activities as tolerated.

## 2015-02-15 NOTE — Progress Notes (Signed)
PCP: Willow OraJose Paz, MD  Subjective:   HPI: Patient is a 44 y.o. male here for right knee pain.  Patient reports for over a year he's had anterior right knee pain. He is currently training for a triathlon and noticed the pain worsening. Tried ibuprofen. Not tried any specific rehab exercises for this. No catching, locking, giving out. Pain is anterior.  Past Medical History  Diagnosis Date  . ADD (attention deficit disorder)     Current Outpatient Prescriptions on File Prior to Visit  Medication Sig Dispense Refill  . albuterol (PROVENTIL HFA;VENTOLIN HFA) 108 (90 BASE) MCG/ACT inhaler Inhale 2 puffs into the lungs every 6 (six) hours as needed for wheezing or shortness of breath. 1 Inhaler 0  . amoxicillin-clavulanate (AUGMENTIN) 875-125 MG per tablet Take 1 tablet by mouth 2 (two) times daily. 20 tablet 0  . amphetamine-dextroamphetamine (ADDERALL XR) 20 MG 24 hr capsule Take 1 capsule (20 mg total) by mouth every morning. 10 capsule 0  . beclomethasone (QVAR) 40 MCG/ACT inhaler Inhale 2 puffs into the lungs 2 (two) times daily. 1 Inhaler 0  . benzonatate (TESSALON) 200 MG capsule Take 1 capsule (200 mg total) by mouth 3 (three) times daily as needed for cough. 20 capsule 0   No current facility-administered medications on file prior to visit.    Past Surgical History  Procedure Laterality Date  . Knee arthroscopy      R at age 44    No Known Allergies  History   Social History  . Marital Status: Married    Spouse Name: N/A  . Number of Children: 2  . Years of Education: N/A   Occupational History  . furniture  Other   Social History Main Topics  . Smoking status: Never Smoker   . Smokeless tobacco: Never Used  . Alcohol Use: 0.0 oz/week    0 Standard drinks or equivalent per week     Comment: socially   . Drug Use: No  . Sexual Activity: Not on file   Other Topics Concern  . Not on file   Social History Narrative   re-married           Family History   Problem Relation Age of Onset  . CAD Neg Hx   . Diabetes Neg Hx   . Colon cancer Father     dx in his 3970s  . Prostate cancer Neg Hx   . Alcohol abuse Father     BP 150/108 mmHg  Pulse 92  Ht 5\' 6"  (1.676 m)  Wt 170 lb (77.111 kg)  BMI 27.45 kg/m2  Review of Systems: See HPI above.    Objective:  Physical Exam:  Gen: NAD  Right knee: No gross deformity, ecchymoses, swelling.  TTP patellar tendon.  No joint line or other tenderness. FROM.  Pain resisted knee extension. Negative ant/post drawers. Negative valgus/varus testing. Negative lachmanns. Negative mcmurrays, apleys, patellar apprehension. NV intact distally.    Assessment & Plan:  1. Right patellar tendinitis - ultrasound showed some disorganization of tendon proximally and a small calcification.  One hypoechoic area is only about 5% of the tendon.  Shown home exercises to do daily.  Icing, nsaids as needed.  Patellar strap.  Nitro patches - discussed risks of headache, skin irritation.  F/u in 6 weeks.  Activities as tolerated.

## 2015-02-18 ENCOUNTER — Telehealth: Payer: Self-pay | Admitting: Internal Medicine

## 2015-02-18 NOTE — Telephone Encounter (Signed)
Please advise 

## 2015-02-18 NOTE — Telephone Encounter (Signed)
Relation to pt: SELF  Call back number: [phone]   Reason for call:    Pt stated Dr. Abner GreenspanBlyth prescribed amphetamine-dextroamphetamine (ADDERALL XR) 20 MG 24 hr capsule for 10 days whereas Dr. Drue NovelPaz would have prescribed 30 day to hold him over until next appointment. Please advise

## 2015-02-18 NOTE — Telephone Encounter (Signed)
error:[id number] ° °

## 2015-02-20 NOTE — Telephone Encounter (Signed)
Ok Engineer, [employer] adder all #30 2 prescriptions

## 2015-02-21 MED ORDER — AMPHETAMINE-DEXTROAMPHET ER 20 MG PO CP24: 20.0000 mg | ORAL_CAPSULE | ORAL | Status: DC

## 2015-02-21 NOTE — Telephone Encounter (Signed)
Rx's printed, awaiting MD signature.  

## 2015-02-21 NOTE — Telephone Encounter (Signed)
Rx's placed at front desk for Pt to pick up. PT MUST KEEP APPT FOR NEXT MONTH.

## 2015-02-21 NOTE — Addendum Note (Signed)
Addended by: Dorette GrateFAULKNER, Ly Bacchi C on: 02/21/2015 07:49 AM   Modules accepted: Orders

## 2015-02-23 NOTE — Telephone Encounter (Signed)
Advised pt and stated he will pick up RX and keep appointment for next month. Thank you

## 2015-03-03 ENCOUNTER — Other Ambulatory Visit: Payer: Self-pay

## 2015-03-03 NOTE — Telephone Encounter (Signed)
error:[id number] ° °

## 2015-03-18 ENCOUNTER — Ambulatory Visit: Payer: BLUE CROSS/BLUE SHIELD | Admitting: Internal Medicine

## 2015-03-23 ENCOUNTER — Ambulatory Visit: Payer: BLUE CROSS/BLUE SHIELD | Admitting: Internal Medicine

## 2015-04-07 ENCOUNTER — Encounter: Payer: Self-pay | Admitting: Internal Medicine

## 2015-04-07 ENCOUNTER — Telehealth: Payer: Self-pay | Admitting: Internal Medicine

## 2015-04-07 NOTE — Telephone Encounter (Signed)
No charge. 

## 2015-04-07 NOTE — Telephone Encounter (Signed)
Pt was no show for follow up on 5/18. Letter sent. Charge?

## 2015-04-29 ENCOUNTER — Encounter: Payer: Self-pay | Admitting: Internal Medicine

## 2015-04-29 ENCOUNTER — Ambulatory Visit (INDEPENDENT_AMBULATORY_CARE_PROVIDER_SITE_OTHER): Payer: BLUE CROSS/BLUE SHIELD | Admitting: Internal Medicine

## 2015-04-29 VITALS — BP 128/84 | HR 87 | Temp 98.2°F | Ht 66.0 in | Wt 174.1 lb

## 2015-04-29 DIAGNOSIS — F909 Attention-deficit hyperactivity disorder, unspecified type: Secondary | ICD-10-CM | POA: Diagnosis not present

## 2015-04-29 DIAGNOSIS — F988 Other specified behavioral and emotional disorders with onset usually occurring in childhood and adolescence: Secondary | ICD-10-CM

## 2015-04-29 MED ORDER — AMPHETAMINE-DEXTROAMPHET ER 20 MG PO CP24: 20.0000 mg | ORAL_CAPSULE | ORAL | Status: DC

## 2015-04-29 NOTE — Progress Notes (Signed)
   Subjective:    Patient ID: Kyle Dunn, male    DOB: [birth year], 44 y.o.   MRN: [id number]  DOS:  04/29/2015 Type of visit - description : rov Interval history: ADD, symptoms well-controlled, takes medications daily, skip the weekends, from time to time takes extra tablet in the afternoon. Had respiratory symptoms largely resolved.  Review of Systems Reports no problems with insomnia, anxiety or depression. He is extremely active and feeling well  Past Medical History  Diagnosis Date  . ADD (attention deficit disorder)     Past Surgical History  Procedure Laterality Date  . Knee arthroscopy      R at age 37    History   Social History  . Marital Status: Married    Spouse Name: N/A  . Number of Children: 2  . Years of Education: N/A   Occupational History  . furniture  Other   Social History Main Topics  . Smoking status: Never Smoker   . Smokeless tobacco: Never Used  . Alcohol Use: 0.0 oz/week    0 Standard drinks or equivalent per week     Comment: socially   . Drug Use: No  . Sexual Activity: Not on file   Other Topics Concern  . Not on file   Social History Narrative   re-married               Medication List       This list is accurate as of: 04/29/15 11:59 PM.  Always use your most recent med list.               amphetamine-dextroamphetamine 20 MG 24 hr capsule  Commonly known as:  ADDERALL XR  Take 1 capsule (20 mg total) by mouth every morning.           Objective:   Physical Exam BP 128/84 mmHg  Pulse 87  Temp(Src) 98.2 F (36.8 C) (Oral)  Ht 5\' 6"  (1.676 m)  Wt 174 lb 2 oz (78.983 kg)  BMI 28.12 kg/m2  SpO2 98%  General:   Well developed, well nourished . NAD.  HEENT:  Normocephalic . Face symmetric, atraumatic Neurologic:  alert & oriented X3.  Speech normal, gait appropriate for age and unassisted Psych--  Cognition and judgment appear intact.  Cooperative with normal attention span and concentration.   Behavior appropriate. No anxious or depressed appearing.      Assessment & Plan:

## 2015-04-29 NOTE — Assessment & Plan Note (Addendum)
Symptoms well-controlled, he is able to focus well, takes one tablet daily except during the weekends, occasionally when he is very busy he takes a second dose in the afternoons. Again he mentioned that he does not feel a "crispy feeling" after taking the medication however his focus is great consequently we'll continue with current dose of medications.

## 2015-04-29 NOTE — Progress Notes (Signed)
Pre visit review using our clinic review tool, if applicable. No additional management support is needed unless otherwise documented below in the visit note. 

## 2015-06-18 ENCOUNTER — Ambulatory Visit: Payer: BLUE CROSS/BLUE SHIELD | Admitting: Family Medicine

## 2015-07-18 ENCOUNTER — Telehealth: Payer: Self-pay | Admitting: Internal Medicine

## 2015-07-18 MED ORDER — AMPHETAMINE-DEXTROAMPHET ER 20 MG PO CP24: 20.0000 mg | ORAL_CAPSULE | ORAL | Status: DC

## 2015-07-18 NOTE — Telephone Encounter (Signed)
°  Relation to ZO:XWRU Call back [phone] Pharmacy:[facility] [address]  Reason for call: pt is needing rx amphetamine-dextroamphetamine (ADDERALL XR) 20 MG 24 hr capsule , please call when ready and available for pick up

## 2015-07-18 NOTE — Telephone Encounter (Signed)
Pt is requesting refill on Adderall.  Last OV: 07/30/2014 Last Fill: 04/29/2015 #30 0RF UDS: 07/30/2014 Low risk  Due for UDS  Please advise.

## 2015-07-18 NOTE — Telephone Encounter (Signed)
LMOM informing Pt that Rx is ready for pick up at front desk at his convenience. Controlled substance contract placed with Rx's and noted included informing that he is due for CPE in December 2016 as well as a UDS.

## 2015-07-18 NOTE — Telephone Encounter (Signed)
Rx's for September and October 2016 printed, awaiting MD signature. Controlled Substance Contract Printed.

## 2015-07-18 NOTE — Telephone Encounter (Signed)
Okay #30, 2 prescriptions 

## 2015-07-29 ENCOUNTER — Telehealth: Payer: Self-pay

## 2015-07-29 NOTE — Telephone Encounter (Signed)
UDS: 07/18/2015   Positive for Adderall   Low risk per Dr. Drue Novel 07/29/2015

## 2015-09-27 ENCOUNTER — Telehealth: Payer: Self-pay | Admitting: Internal Medicine

## 2015-09-27 MED ORDER — AMPHETAMINE-DEXTROAMPHET ER 20 MG PO CP24: 20.0000 mg | ORAL_CAPSULE | ORAL | Status: DC

## 2015-09-27 NOTE — Telephone Encounter (Signed)
Rx's printed, awaiting MD signature.  

## 2015-09-27 NOTE — Telephone Encounter (Signed)
Caller name: Minerva Areolaric  Relationship to patient: Self   Can be reached: [phone]   Reason for call: pt is requesting a refill on his Adderall Rx.

## 2015-09-27 NOTE — Telephone Encounter (Signed)
LMOM informing Pt that Rx's are ready for pick up at the front desk.

## 2015-09-27 NOTE — Telephone Encounter (Signed)
Okay #30 and 2 prescriptions

## 2015-09-27 NOTE — Telephone Encounter (Signed)
Pt is requesting refill on Adderall.  Last OV: 04/29/2015, CPE scheduled 10/26/2015 at 3 PM Last Fill: 07/18/2015 #30 and 0RF For September and October 2016 UDS: 07/18/2015 Low risk  Please advise.

## 2015-10-25 ENCOUNTER — Telehealth: Payer: Self-pay

## 2015-10-26 ENCOUNTER — Encounter: Payer: BLUE CROSS/BLUE SHIELD | Admitting: Internal Medicine

## 2015-12-05 ENCOUNTER — Telehealth: Payer: Self-pay | Admitting: Internal Medicine

## 2015-12-05 NOTE — Telephone Encounter (Signed)
Caller name: Rick Carruthers Relation to pt: self Call back number: [phone] Pharmacy:  Reason for call: Pt called requesting rx for amphetamine-dextroamphetamine (ADDERALL XR) 20 MG 24 hr capsule. Please Advise.

## 2015-12-06 MED ORDER — AMPHETAMINE-DEXTROAMPHET ER 20 MG PO CP24: 20.0000 mg | ORAL_CAPSULE | ORAL | Status: DC

## 2015-12-06 NOTE — Telephone Encounter (Signed)
Okay #60, 2 prescriptions 

## 2015-12-06 NOTE — Telephone Encounter (Signed)
LVM for pt to return call

## 2015-12-06 NOTE — Telephone Encounter (Signed)
Please inform Pt that Rx has been placed at front desk for pick up at his convenience. Thank you.  

## 2015-12-06 NOTE — Telephone Encounter (Signed)
Pt is requesting refill on Adderall.  Last OV: 04/29/2015 Last Fill: 09/27/2015 #30 and 0RF UDS: 07/18/2015 Low risk  Please advise.

## 2015-12-06 NOTE — Telephone Encounter (Signed)
Rx's for February and March 2017 printed, awaiting MD signature.  

## 2015-12-23 ENCOUNTER — Telehealth: Payer: Self-pay

## 2015-12-23 NOTE — Telephone Encounter (Signed)
UDS: 12/07/2015  Positive for Adderall   Low risk per Dr. Drue Novel 12/23/2015

## 2016-01-09 ENCOUNTER — Encounter: Payer: Self-pay | Admitting: Internal Medicine

## 2016-01-24 ENCOUNTER — Ambulatory Visit: Payer: BLUE CROSS/BLUE SHIELD | Admitting: Internal Medicine

## 2016-01-30 ENCOUNTER — Ambulatory Visit: Payer: BLUE CROSS/BLUE SHIELD | Admitting: Internal Medicine

## 2016-01-30 ENCOUNTER — Telehealth: Payer: Self-pay | Admitting: Internal Medicine

## 2016-01-30 NOTE — Telephone Encounter (Signed)
Pt had several co workers out today and not able to leave this afternoon. Pt rescheduled for 02/06/16 4:00pm. Charge or no charge?

## 2016-01-30 NOTE — Telephone Encounter (Signed)
No Charge 

## 2016-02-02 ENCOUNTER — Encounter: Payer: Self-pay | Admitting: Internal Medicine

## 2016-02-02 ENCOUNTER — Ambulatory Visit (INDEPENDENT_AMBULATORY_CARE_PROVIDER_SITE_OTHER): Payer: BLUE CROSS/BLUE SHIELD | Admitting: Internal Medicine

## 2016-02-02 VITALS — BP 116/74 | HR 72 | Temp 97.7°F | Ht 66.0 in | Wt 177.5 lb

## 2016-02-02 DIAGNOSIS — Z0000 Encounter for general adult medical examination without abnormal findings: Secondary | ICD-10-CM | POA: Insufficient documentation

## 2016-02-02 DIAGNOSIS — Z09 Encounter for follow-up examination after completed treatment for conditions other than malignant neoplasm: Secondary | ICD-10-CM

## 2016-02-02 LAB — LIPID PANEL
CHOL/HDL RATIO: 5
Cholesterol: 245 mg/dL — ABNORMAL HIGH (ref 0–200)
HDL: 52.7 mg/dL (ref >39.00)
LDL CALC: 169 mg/dL — AB (ref 0–99)
NONHDL: 191.82
TRIGLYCERIDES: 115 mg/dL (ref 0.0–149.0)
VLDL: 23 mg/dL (ref 0.0–40.0)

## 2016-02-02 LAB — COMPREHENSIVE METABOLIC PANEL
ALT: 55 U/L — ABNORMAL HIGH (ref 0–53)
AST: 37 U/L (ref 0–37)
Albumin: 4.6 g/dL (ref 3.5–5.2)
Alkaline Phosphatase: 47 U/L (ref 39–117)
BUN: 24 mg/dL — ABNORMAL HIGH (ref 6–23)
CALCIUM: 9.3 mg/dL (ref 8.4–10.5)
CO2: 28 meq/L (ref 19–32)
Chloride: 101 mEq/L (ref 96–112)
Creatinine, Ser: 1.02 mg/dL (ref 0.40–1.50)
GFR: 83.63 mL/min (ref >60.00)
GLUCOSE: 88 mg/dL (ref 70–99)
POTASSIUM: 3.8 meq/L (ref 3.5–5.1)
Sodium: 137 mEq/L (ref 135–145)
Total Bilirubin: 0.5 mg/dL (ref 0.2–1.2)
Total Protein: 7.4 g/dL (ref 6.0–8.3)

## 2016-02-02 LAB — CBC WITH DIFFERENTIAL/PLATELET
Basophils Absolute: 0 10*3/uL (ref 0.0–0.1)
Basophils Relative: 0.8 % (ref 0.0–3.0)
EOS PCT: 3.1 % (ref 0.0–5.0)
Eosinophils Absolute: 0.1 10*3/uL (ref 0.0–0.7)
HEMATOCRIT: 46.3 % (ref 39.0–52.0)
Hemoglobin: 16.1 g/dL (ref 13.0–17.0)
LYMPHS ABS: 1.4 10*3/uL (ref 0.7–4.0)
LYMPHS PCT: 30.4 % (ref 12.0–46.0)
MCHC: 34.8 g/dL (ref 30.0–36.0)
MCV: 83.4 fl (ref 78.0–100.0)
MONOS PCT: 13.4 % — AB (ref 3.0–12.0)
Monocytes Absolute: 0.6 10*3/uL (ref 0.1–1.0)
NEUTROS ABS: 2.4 10*3/uL (ref 1.4–7.7)
NEUTROS PCT: 52.3 % (ref 43.0–77.0)
PLATELETS: 209 10*3/uL (ref 150.0–400.0)
RBC: 5.55 Mil/uL (ref 4.22–5.81)
RDW: 13 % (ref 11.5–15.5)
WBC: 4.6 10*3/uL (ref 4.0–10.5)

## 2016-02-02 LAB — HIV ANTIBODY (ROUTINE TESTING W REFLEX): HIV 1&2 Ab, 4th Generation: NONREACTIVE

## 2016-02-02 LAB — TSH: TSH: 1.27 u[IU]/mL (ref 0.35–4.50)

## 2016-02-02 MED ORDER — AMPHETAMINE-DEXTROAMPHET ER 20 MG PO CP24: 20.0000 mg | ORAL_CAPSULE | ORAL | Status: DC

## 2016-02-02 NOTE — Patient Instructions (Signed)
GO TO THE LAB :      Get the blood work     GO TO THE FRONT DESK Schedule your next appointment for a  checkup When?   8 to 9 months Fasting?  No

## 2016-02-02 NOTE — Assessment & Plan Note (Addendum)
Td 2014 Never had a cscope , father had colon dx  in his 5470s, no other family members affected, no FH of colon polyps . Will get a colonoscopy at age 45 He has a very healthy lifestyle. Labs: CMP, CBC, TSH, FLP, HIV, testosterone level

## 2016-02-02 NOTE — Progress Notes (Signed)
Pre visit review using our clinic review tool, if applicable. No additional management support is needed unless otherwise documented below in the visit note. 

## 2016-02-02 NOTE — Assessment & Plan Note (Signed)
Here for a CPX, ADD well controlled, no change RTC 8-9 months

## 2016-02-02 NOTE — Progress Notes (Signed)
Subjective:    Patient ID: Kyle Dunn, male    DOB: December 15, 1970, 45 y.o.   MRN: [id number]  DOS:  02/02/2016 Type of visit - description : CPX Interval history: Feeling well, he remains active. At some point a few months ago was fatigue, taking natural vitamins to boost his  testosterone,  like his testosterone checked.   Review of Systems  Constitutional: No fever. No chills. No unexplained wt changes. No unusual sweats  HEENT: No dental problems, no ear discharge, no facial swelling, no voice changes. No eye discharge, no eye  redness , no  intolerance to light   Respiratory: No wheezing , no  difficulty breathing. No cough , no mucus production  Cardiovascular: No CP, no leg swelling , no  Palpitations  GI: no nausea, no vomiting, no diarrhea , no  abdominal pain.  No blood in the stools. No dysphagia, no odynophagia    Endocrine: No polyphagia, no polyuria , no polydipsia  GU: No dysuria, gross hematuria, difficulty urinating. No urinary urgency, no frequency.  Musculoskeletal: No joint swellings or unusual aches or pains  Skin: No change in the color of the skin, palor , no  Rash  Allergic, immunologic: No environmental allergies , no  food allergies  Neurological: No dizziness no  syncope. No headaches. No diplopia, no slurred, no slurred speech, no motor deficits, no facial  Numbness  Hematological: No enlarged lymph nodes, no easy bruising , no unusual bleedings  Psychiatry: No suicidal ideas, no hallucinations, no beavior problems, no confusion.  No unusual/severe anxiety, no depression  Past Medical History  Diagnosis Date  . ADD (attention deficit disorder)     Past Surgical History  Procedure Laterality Date  . Knee arthroscopy      R at age 36    Social History   Social History  . Marital Status: Married    Spouse Name: N/A  . Number of Children: 2  . Years of Education: N/A   Occupational History  . furniture  Other   Social  History Main Topics  . Smoking status: Never Smoker   . Smokeless tobacco: Never Used  . Alcohol Use: 0.0 oz/week    0 Standard drinks or equivalent per week     Comment: socially   . Drug Use: No  . Sexual Activity: Not on file   Other Topics Concern  . Not on file   Social History Narrative   re-married            Family History  Problem Relation Age of Onset  . CAD Neg Hx   . Diabetes Neg Hx   . Colon cancer Father     dx in his 21s  . Prostate cancer Neg Hx   . Alcohol abuse Father     Family History  Problem Relation Age of Onset  . CAD Neg Hx   . Diabetes Neg Hx   . Colon cancer Father     dx in his 45s  . Prostate cancer Neg Hx   . Alcohol abuse Father        Medication List       This list is accurate as of: 02/02/16  4:43 PM.  Always use your most recent med list.               amphetamine-dextroamphetamine 20 MG 24 hr capsule  Commonly known as:  ADDERALL XR  Take 1 capsule (20 mg total) by mouth every morning.  Objective:   Physical Exam BP 116/74 mmHg  Pulse 72  Temp(Src) 97.7 F (36.5 C) (Oral)  Ht 5\' 6"  (1.676 m)  Wt 177 lb 8 oz (80.513 kg)  BMI 28.66 kg/m2  SpO2 97%  General:   Well developed, well nourished . NAD.  Neck: No  Thyromegaly  HEENT:  Normocephalic . Face symmetric, atraumatic Lungs:  CTA B Normal respiratory effort, no intercostal retractions, no accessory muscle use. Heart: RRR,  no murmur.  No pretibial edema bilaterally  Abdomen:  Not distended, soft, non-tender. No rebound or rigidity.   Skin: Exposed areas without rash. Not pale. Not jaundice Neurologic:  alert & oriented X3.  Speech normal, gait appropriate for age and unassisted Strength symmetric and appropriate for age.  Psych: Cognition and judgment appear intact.  Cooperative with normal attention span and concentration.  Behavior appropriate. No anxious or depressed appearing.    Assessment & Plan:    Assessment ADD  Plan: Here for a CPX, ADD well controlled, no change RTC 8-9 months

## 2016-02-07 LAB — TESTOS,TOTAL,FREE AND SHBG (FEMALE)
Sex Hormone Binding Glob.: 44 nmol/L (ref 10–50)
Testosterone, Free: 49.3 pg/mL (ref 35.0–155.0)
Testosterone,Total,LC/MS/MS: 414 ng/dL (ref 250–1100)

## 2016-02-15 NOTE — Telephone Encounter (Signed)
Pre visit call completed 

## 2016-04-13 ENCOUNTER — Telehealth: Payer: Self-pay | Admitting: Internal Medicine

## 2016-04-13 DIAGNOSIS — E785 Hyperlipidemia, unspecified: Secondary | ICD-10-CM

## 2016-04-13 MED ORDER — AMPHETAMINE-DEXTROAMPHET ER 20 MG PO CP24: 20.0000 mg | ORAL_CAPSULE | ORAL | Status: DC

## 2016-04-13 NOTE — Telephone Encounter (Signed)
Ok RF x 3 Rxs

## 2016-04-13 NOTE — Telephone Encounter (Signed)
Spoke w/ Pt, informed him that Rx's are ready for pick up at the front desk. Pt requesting to have FLP rechecked, he has been exercising more and eating better. Okay per PCP. FLP ordered. Lab appt scheduled for 04/16/2016 at 0745, Pt will be here closer to 0800.

## 2016-04-13 NOTE — Telephone Encounter (Signed)
Can be reached: [phone]  Reason for call: pt called for refill on adderall. He has 3 left. Takes 1 a day but not everyday. Please call when ready for pick up.

## 2016-04-13 NOTE — Telephone Encounter (Signed)
Pt is requesting refill on Adderall.  Last OV: 02/02/2016 Last Fill: 02/02/2016 #30 and 0RF For April and May 2017 UDS: 12/07/2015 Low risk  Please advise.

## 2016-04-13 NOTE — Telephone Encounter (Signed)
Rx's printed for June, July and August 2017, awaiting MD signature.

## 2016-04-16 ENCOUNTER — Other Ambulatory Visit: Payer: BLUE CROSS/BLUE SHIELD

## 2016-05-01 ENCOUNTER — Telehealth: Payer: Self-pay | Admitting: Internal Medicine

## 2016-05-01 ENCOUNTER — Emergency Department (HOSPITAL_BASED_OUTPATIENT_CLINIC_OR_DEPARTMENT_OTHER): Payer: BLUE CROSS/BLUE SHIELD

## 2016-05-01 ENCOUNTER — Emergency Department (HOSPITAL_BASED_OUTPATIENT_CLINIC_OR_DEPARTMENT_OTHER)
Admission: EM | Admit: 2016-05-01 | Discharge: 2016-05-01 | Disposition: A | Discharge status: Home / Self Care | Payer: BLUE CROSS/BLUE SHIELD | Attending: Emergency Medicine | Admitting: Emergency Medicine

## 2016-05-01 ENCOUNTER — Encounter (HOSPITAL_BASED_OUTPATIENT_CLINIC_OR_DEPARTMENT_OTHER): Payer: Self-pay | Admitting: *Deleted

## 2016-05-01 DIAGNOSIS — Z79899 Other long term (current) drug therapy: Secondary | ICD-10-CM | POA: Diagnosis not present

## 2016-05-01 DIAGNOSIS — L03113 Cellulitis of right upper limb: Secondary | ICD-10-CM

## 2016-05-01 LAB — CBC WITH DIFFERENTIAL/PLATELET
BASOS ABS: 0 10*3/uL (ref 0.0–0.1)
BASOS PCT: 0 %
Eosinophils Absolute: 0.1 10*3/uL (ref 0.0–0.7)
Eosinophils Relative: 1 %
HEMATOCRIT: 43.5 % (ref 39.0–52.0)
HEMOGLOBIN: 15 g/dL (ref 13.0–17.0)
LYMPHS PCT: 16 %
Lymphs Abs: 1.2 10*3/uL (ref 0.7–4.0)
MCH: 29.6 pg (ref 26.0–34.0)
MCHC: 34.5 g/dL (ref 30.0–36.0)
MCV: 86 fL (ref 78.0–100.0)
MONO ABS: 0.9 10*3/uL (ref 0.1–1.0)
Monocytes Relative: 12 %
NEUTROS ABS: 5.2 10*3/uL (ref 1.7–7.7)
NEUTROS PCT: 71 %
Platelets: 192 10*3/uL (ref 150–400)
RBC: 5.06 MIL/uL (ref 4.22–5.81)
RDW: 12.7 % (ref 11.5–15.5)
WBC: 7.3 10*3/uL (ref 4.0–10.5)

## 2016-05-01 MED ORDER — CEFAZOLIN IN D5W 1 GM/50ML IV SOLN
1.0000 g | Freq: Once | INTRAVENOUS | Status: AC
  Administered 2016-05-01: 1 g via INTRAVENOUS
  Filled 2016-05-01: qty 50

## 2016-05-01 MED ORDER — DOXYCYCLINE HYCLATE 100 MG PO CAPS: 100.0000 mg | ORAL_CAPSULE | Freq: Two times a day (BID) | ORAL | Status: DC

## 2016-05-01 MED ORDER — IBUPROFEN 800 MG PO TABS
800.0000 mg | ORAL_TABLET | Freq: Once | ORAL | Status: AC
  Administered 2016-05-01: 800 mg via ORAL
  Filled 2016-05-01: qty 1

## 2016-05-01 MED FILL — DOXYCYCLINE HYC 100 MG CAP: 100 | 10 days supply | Qty: 20 | Fill #0

## 2016-05-01 NOTE — ED Notes (Signed)
Pt states he thought he had a splinter in middle finger and when he mashed it redness spread over back of hand. Puss area to lateral side of  Middle finger of left hand.

## 2016-05-01 NOTE — ED Provider Notes (Signed)
CSN: [id number]     Arrival date & time 05/01/16  1154 History   First MD Initiated Contact with Patient 05/01/16 1158     No chief complaint on file.  HPI  [name] is a 45 year old male with PMHx of ADD presenting with hand pain and redness. Patient reports noting what he believed to be a splinter to his right middle finger 2 days ago. He states that it was a small scab to the lateral side of his right middle finger just distal to the MCP joint. He inserted a needle in an attempt to remove what he believes was the splinter but states nothing came out. He woke the next day with a pus pocket at the site. He again inserted a needle to remove the pus and reports getting a small amount of serous drainage but no purulence. He woke this morning with pain and redness over the right second and third MCP joints. The redness has now begun streaking up his right forearm. He reports the area is warm to the touch. He states that the redness has spread significantly over the past 3-4 hours. He denies loss of range of motion of the hand but states that it is painful to move his fingers now. He does note that he is unsure what caused the initial scab and that he has a cat who frequently bites and when they are playing. Denies systemic symptoms of fever, chills, diaphoresis, nausea or vomiting. No other complaints today. No medications PTA.   Past Medical History  Diagnosis Date  . ADD (attention deficit disorder)    Past Surgical History  Procedure Laterality Date  . Knee arthroscopy      R at age 45   Family History  Problem Relation Age of Onset  . CAD Neg Hx   . Diabetes Neg Hx   . Colon cancer Father     dx in his 3170s  . Prostate cancer Neg Hx   . Alcohol abuse Father    Social History  Substance Use Topics  . Smoking status: Never Smoker   . Smokeless tobacco: Never Used  . Alcohol Use: 0.0 oz/week    0 Standard drinks or equivalent per week     Comment: socially     Review of Systems    All other systems reviewed and are negative.     Allergies  Review of patient's allergies indicates no known allergies.  Home Medications   Prior to Admission medications   Medication Sig Start Date End Date Taking? Authorizing Provider  amphetamine-dextroamphetamine (ADDERALL XR) 20 MG 24 hr capsule Take 1 capsule (20 mg total) by mouth every morning. 04/13/16  Yes Wanda PlumpJose E Paz, MD  doxycycline (VIBRAMYCIN) 100 MG capsule Take 1 capsule (100 mg total) by mouth 2 (two) times daily. 05/01/16   Sadie Pickar, PA-C   BP 113/79 mmHg  Pulse 82  Temp(Src) 98.4 F (36.9 C) (Oral)  Resp 16  Ht 5\' 6"  (1.676 m)  Wt 77.111 kg  BMI 27.45 kg/m2  SpO2 100% Physical Exam  Constitutional: He appears well-developed and well-nourished. No distress.  HENT:  Head: Normocephalic and atraumatic.  Eyes: Conjunctivae are normal. Right eye exhibits no discharge. Left eye exhibits no discharge. No scleral icterus.  Neck: Normal range of motion.  Cardiovascular: Normal rate, regular rhythm and intact distal pulses.   Cap refill < 2 seconds  Pulmonary/Chest: Effort normal. No respiratory distress.  Musculoskeletal: Normal range of motion.  Soft tissue swelling  over the 2nd and 3rd MCP joints of the right hand with overlying erythema. FROM of the digits and wrist intact. Exquisite tenderness to palpation of MCP joints  Neurological: He is alert. Coordination normal.  Skin: Skin is warm and dry. There is erythema.  1.5 cm circular erythema with puncture wound centrally. Pt reports this puncture is from his needle at home. Small amount of serous fluid expressed on palpation. No purulence. No fluctuance or induration. Erythema involving the dorsal surface of the right second and third MCP joints with faint streaking extending towards the forearm.  Psychiatric: He has a normal mood and affect. His behavior is normal.  Nursing note and vitals reviewed.     ED Course  Procedures (including critical care  time) Labs Review Labs Reviewed  CBC WITH DIFFERENTIAL/PLATELET    Imaging Review Dg Hand Complete Right  05/01/2016  CLINICAL DATA:  Redness and swelling along the dorsal aspect of the hand. EXAM: RIGHT HAND - COMPLETE 3+ VIEW COMPARISON:  None. FINDINGS: Extensive soft tissue swelling is noted over the dorsal aspect of the hand. There is no underlying fracture. No radiopaque foreign body is present. No gas is evident within the soft tissues. The hand is otherwise unremarkable. IMPRESSION: 1. Soft tissue swelling over the dorsal aspect of the hand without underlying fracture or radiopaque foreign body. Question cellulitis or soft tissue edema related to trauma. Electronically Signed   By: Marin Robertshristopher  Mattern M.D.   On: 05/01/2016 13:18   I have personally reviewed and evaluated these images and lab results as part of my medical decision-making.   EKG Interpretation None     1:00 - Discussed with hand surgeon, Dr. Merlyn LotKuzma. Recommends right hand xray, CBC and IV abx in ER. Will re-consult after results.   MDM   Final diagnoses:  Cellulitis of right upper extremity   45 year old male presenting with erythema, warmth, swelling and tenderness at right MCP joints. Pt reports small area thought to be a splinter which he dug out with a needle two days ago. Afebrile and nontoxic appearing. Erythema noted to dorsal hand over 2nd and 3rd MCP with streaking towards forearm. Area is warm. No fluctuance or induration. Small skin breakdown noted to right middle finger where pt had puncture pus pocket. Picture in note. Hand is neurovascularly intact with FROM. Tenderness over MCP joints. No leukocytosis. Hand xray negative for foreign body. Soft tissue swelling over dorsum of hand noted. Dose of ancef given in ED. Consulted hand surgery, Dr. Merlyn LotKuzma, who personally viewed pt's picture. Recommends discharge on doxycycline and pt is to call his office tomorrow to schedule an appointment. Discussed this with pt  who expresses understanding of the plan. Return precautions given in discharge paperwork and discussed with pt at bedside. Pt stable for discharge     Alveta HeimlichStevi Gailyn Crook, PA-C 05/01/16 1552  Laurence Spatesachel Morgan Little, MD 05/02/16 [phone]

## 2016-05-01 NOTE — ED Notes (Signed)
ICE pack given. 

## 2016-05-01 NOTE — Telephone Encounter (Signed)
Per chart, pt went to the ER as advised.   

## 2016-05-01 NOTE — Telephone Encounter (Signed)
[facility] - Client TELEPHONE ADVICE RECORD [facility]  Patient Name: Kyle Dunn  DOB: July 03, 1971    Initial Comment Caller states, has a round puncture on his finger, a small round circle, pus drained from it, Now the swelling has now gone into his knuckle, it is burning badly. He wants an appt    Nurse Assessment  Nurse: Laural BenesJohnson, RN, Dondra SpryGail Date/Time Lamount Cohen(Eastern Time): 05/01/2016 11:12:57 AM  Confirm and document reason for call. If symptomatic, describe symptoms. You must click the next button to save text entered. ---Thayer Ohmhris thought he had a splinter of middle finger -- a round circle with puncture red and today looked like a blood blister -- popped it and now both knuckles are now inflamed red and very swollen hot touch  Has the patient traveled out of the country within the last 30 days? ---No  Does the patient have any new or worsening symptoms? ---Yes  Will a triage be completed? ---Yes  Related visit to physician within the last 2 weeks? ---No  Does the PT have any chronic conditions? (i.e. diabetes, asthma, etc.) ---No  Is this a behavioral health or substance abuse call? ---No     Guidelines    Guideline Title Affirmed Question Affirmed Notes  Cuts and Lacerations [1] Looks infected AND [2] large red area (>2 inches or 5 cm) or streak    Final Disposition User   See Physician within 4 Hours (or PCP triage) Laural BenesJohnson, RN, Dondra SpryGail    Comments  NOTE; no appointments available to meet the severity of his wound; has faint red line traveling up hand knuckles and hand are now swollen and red since popping the blister this am. hot to touch NEEDS CARE IMMEDIATELY He will go to UC or ED at this time.   Referrals  REFERRED TO PCP OFFICE

## 2016-05-01 NOTE — Discharge Instructions (Signed)
Call Dr. Merrilee SeashoreKuzma's office tomorrow to schedule a follow up appointment.   Cellulitis Cellulitis is an infection of the skin and the tissue beneath it. The infected area is usually red and tender. Cellulitis occurs most often in the arms and lower legs.  CAUSES  Cellulitis is caused by bacteria that enter the skin through cracks or cuts in the skin. The most common types of bacteria that cause cellulitis are staphylococci and streptococci. SIGNS AND SYMPTOMS   Redness and warmth.  Swelling.  Tenderness or pain.  Fever. DIAGNOSIS  Your health care provider can usually determine what is wrong based on a physical exam. Blood tests may also be done. TREATMENT  Treatment usually involves taking an antibiotic medicine. HOME CARE INSTRUCTIONS   Take your antibiotic medicine as directed by your health care provider. Finish the antibiotic even if you start to feel better.  Keep the infected arm or leg elevated to reduce swelling.  Apply a warm cloth to the affected area up to 4 times per day to relieve pain.  Take medicines only as directed by your health care provider.  Keep all follow-up visits as directed by your health care provider. SEEK MEDICAL CARE IF:   You notice red streaks coming from the infected area.  Your red area gets larger or turns dark in color.  Your bone or joint underneath the infected area becomes painful after the skin has healed.  Your infection returns in the same area or another area.  You notice a swollen bump in the infected area.  You develop new symptoms.  You have a fever. SEEK IMMEDIATE MEDICAL CARE IF:   You feel very sleepy.  You develop vomiting or diarrhea.  You have a general ill feeling (malaise) with muscle aches and pains.   This information is not intended to replace advice given to you by your health care provider. Make sure you discuss any questions you have with your health care provider.   Document Released: 08/01/2005 Document  Revised: 07/13/2015 Document Reviewed: 01/07/2012 Elsevier Interactive Patient Education [employer]2016 Elsevier Inc.

## 2016-05-16 ENCOUNTER — Telehealth: Payer: Self-pay | Admitting: Internal Medicine

## 2016-05-16 MED ORDER — CIPROFLOXACIN HCL 500 MG PO TABS: 500.0000 mg | ORAL_TABLET | Freq: Two times a day (BID) | ORAL | Status: DC | PRN

## 2016-05-16 NOTE — Telephone Encounter (Signed)
Spoke w/ Pt, he is aware of how to take Cipro PRN for travelers diarrhea. He frequents [place] mission trips. Informed him to see local MD there if severe Sx's arise. Pt verbalized understanding.

## 2016-05-16 NOTE — Telephone Encounter (Signed)
If he only likes cipro for traveler's diarrhea okay to prescribe Cipro 500 mg one tablet twice a day #6  no refills -----> tell pt this is to be taken if diarrhea. If he has severe symptoms needs to see a local doctor. Also, if he decides to take it he needs to avoid excessive sun exposure. If he request a comprehensive travel advice: Needs office visit

## 2016-05-16 NOTE — Telephone Encounter (Signed)
°  Relationship to patient: Self Can be reached: [phone] Pharmacy:  [id number] - [place], [place] - [address] [facility] [address] [phone] (Phone) [phone] (Fax)         Reason for call: Patient is requesting a Rx for Cipro to take with him to [place].

## 2016-05-16 NOTE — Telephone Encounter (Signed)
Should Pt schedule appt to discuss travel advice?

## 2016-07-16 ENCOUNTER — Telehealth: Payer: Self-pay | Admitting: Internal Medicine

## 2016-07-16 MED ORDER — AMPHETAMINE-DEXTROAMPHET ER 20 MG PO CP24: 20.0000 mg | ORAL_CAPSULE | ORAL | 0 refills | Status: DC

## 2016-07-16 NOTE — Telephone Encounter (Signed)
Okay #30 and one refill. Due for a visit, please arrange

## 2016-07-16 NOTE — Telephone Encounter (Signed)
Pt request refill for Adderall   CB: [phone]

## 2016-07-16 NOTE — Telephone Encounter (Signed)
Spoke w/ Pt, informed him that Rx's have been placed at front desk for pick up. Pt verbalized understanding.

## 2016-07-16 NOTE — Telephone Encounter (Signed)
Pt is requesting refill on Adderall.  Last OV: 02/02/2016, ED on 05/01/2016 for cellulitis Last Fill: 04/13/2016 #30 and 0RF UDS: 12/07/2015 Low risk  Please advise.

## 2016-07-20 ENCOUNTER — Encounter: Payer: Self-pay | Admitting: Family Medicine

## 2016-07-20 ENCOUNTER — Ambulatory Visit (INDEPENDENT_AMBULATORY_CARE_PROVIDER_SITE_OTHER): Payer: BLUE CROSS/BLUE SHIELD | Admitting: Family Medicine

## 2016-07-20 DIAGNOSIS — M5010 Cervical disc disorder with radiculopathy, unspecified cervical region: Secondary | ICD-10-CM | POA: Diagnosis not present

## 2016-07-20 MED ORDER — PREDNISONE 10 MG PO TABS: ORAL_TABLET | ORAL | 0 refills | Status: DC

## 2016-07-20 MED ORDER — HYDROCODONE-ACETAMINOPHEN 5-325 MG PO TABS: 1.0000 | ORAL_TABLET | Freq: Four times a day (QID) | ORAL | 0 refills | Status: DC | PRN

## 2016-07-20 NOTE — Patient Instructions (Signed)
You have cervical radiculopathy (a pinched nerve in the neck). Prednisone 6 day dose pack to relieve irritation/inflammation of the nerve. Norco as needed for severe pain (no driving on this medicine). Consider cervical collar. Simple range of motion exercises within limits of pain to prevent further stiffness. Consider physical therapy for stretching, exercises, traction, and modalities. Heat 15 minutes at a time 3-4 times a day to help with spasms. Watch head position when on computers, texting, when sleeping in bed - should in line with back to prevent further nerve traction and irritation. Consider home traction unit if you get benefit with this in physical therapy. If not improving we will consider an MRI. Call me in a week to let me know how you're doing.

## 2016-07-23 ENCOUNTER — Ambulatory Visit: Payer: BLUE CROSS/BLUE SHIELD | Admitting: Sports Medicine

## 2016-07-23 ENCOUNTER — Telehealth: Payer: Self-pay | Admitting: Family Medicine

## 2016-07-23 NOTE — Telephone Encounter (Signed)
Spoke to patient and gave him information provided by the physician. Asked the patient if he wanted to do a MRI and he stated that he would wait a few more days to see if the medication kicks in. He stated that he would call us if he wants us to set up a MRI.

## 2016-07-23 NOTE — Telephone Encounter (Signed)
He's in a tough spot.  Generally we don't know if they've worked for sure until 6-7 days have gone by.  We can't prescribe another narcotic pain medicine (others unlikely to be much more helpful).  I could give him an IM injection but I don't think it would help more than the oral version of the steroids he's already taking.  Typically the next step if the steroids aren't working is to do an MRI of the area (cerivcal spine) with possible injection (epidural).  Does he want to do the MRI?  We could even schedule it at [facility] a few days from now and he could always cancel if he's improving.

## 2016-07-24 DIAGNOSIS — M5010 Cervical disc disorder with radiculopathy, unspecified cervical region: Secondary | ICD-10-CM | POA: Insufficient documentation

## 2016-07-24 NOTE — Progress Notes (Signed)
PCP: Willow OraJose Paz, MD  Subjective:   HPI: Patient is a 45 y.o. male here for neck, left shoulder pain.  Patient denies known injury. States for about a month he's had intermittent problems posterior left shoulder - more constant and severe the past week. Pain level 8/10, sharp. Radiates into left arm and arm goes numb. Worse at night and first thing in the morning. No prior issues. No skin changes. No bowel/bladder dysfunction.  Past Medical History:  Diagnosis Date  . ADD (attention deficit disorder)     Current Outpatient Prescriptions on File Prior to Visit  Medication Sig Dispense Refill  . amphetamine-dextroamphetamine (ADDERALL XR) 20 MG 24 hr capsule Take 1 capsule (20 mg total) by mouth every morning. 30 capsule 0  . amphetamine-dextroamphetamine (ADDERALL XR) 20 MG 24 hr capsule Take 1 capsule (20 mg total) by mouth every morning. 30 capsule 0  . ciprofloxacin (CIPRO) 500 MG tablet Take 1 tablet (500 mg total) by mouth 2 (two) times daily as needed (travelers diarrhea). 6 tablet 0  . doxycycline (VIBRAMYCIN) 100 MG capsule Take 1 capsule (100 mg total) by mouth 2 (two) times daily. 20 capsule 0   No current facility-administered medications on file prior to visit.     Past Surgical History:  Procedure Laterality Date  . KNEE ARTHROSCOPY     R at age 45    No Known Allergies  Social History   Social History  . Marital status: Married    Spouse name: N/A  . Number of children: 2  . Years of education: N/A   Occupational History  . furniture  Other   Social History Main Topics  . Smoking status: Never Smoker  . Smokeless tobacco: Never Used  . Alcohol use 0.0 oz/week     Comment: socially   . Drug use: No  . Sexual activity: Not on file   Other Topics Concern  . Not on file   Social History Narrative   re-married           Family History  Problem Relation Age of Onset  . CAD Neg Hx   . Diabetes Neg Hx   . Colon cancer Father     dx in his 3970s   . Prostate cancer Neg Hx   . Alcohol abuse Father     BP 119/80   Pulse 72   Ht 5\' 6"  (1.676 m)   Wt 170 lb (77.1 kg)   BMI 27.44 kg/m   Review of Systems: See HPI above.    Objective:  Physical Exam:  Gen: NAD, comfortable in exam room  Neck: No gross deformity, swelling, bruising. TTP left cervical paraspinal region, trapezius, in rhomboids.  No midline/bony TTP. FROM neck - pain flexion and left lateral rotation. BUE strength 5/5.   Sensation intact to light touch.   2+ equal reflexes in triceps, biceps, brachioradialis tendons. Negative spurlings.  Left shoulder: No swelling, ecchymoses.  No gross deformity. No TTP except that noted above. FROM. Negative Hawkins, Neers. Negative Yergasons. Strength 5/5 with empty can and resisted internal/external rotation. Negative apprehension.    Assessment & Plan:  1. Left neck, shoulder pain - 2/2 cervical radiculopathy.  Start with prednisone dose pack with norco as needed.  Heat for spasms.  Discussed ergonomic issues.  Call us in a week for an update on his status.  Consider physical therapy if improving, MRI if not improving.

## 2016-07-24 NOTE — Assessment & Plan Note (Signed)
Start with prednisone dose pack with norco as needed.  Heat for spasms.  Discussed ergonomic issues.  Call us in a week for an update on his status.  Consider physical therapy if improving, MRI if not improving.

## 2016-07-27 ENCOUNTER — Telehealth: Payer: Self-pay | Admitting: Family Medicine

## 2016-07-27 MED ORDER — PREDNISONE 10 MG PO TABS: ORAL_TABLET | ORAL | 0 refills | Status: DC

## 2016-07-27 NOTE — Telephone Encounter (Signed)
This is ok with me - I have [facility] [place].  Please let him know.  Thanks!

## 2016-07-27 NOTE — Telephone Encounter (Signed)
-----   Message from Lizbeth BarkMelanie L Ceresi sent at 07/27/2016  8:14 AM EDT ----- Regarding: phone message Contact: [phone] Pt states his Physical therapist thinks he should have another round prednisone. Please fax script to his pharmacy.

## 2016-08-01 ENCOUNTER — Ambulatory Visit: Payer: BLUE CROSS/BLUE SHIELD | Admitting: Internal Medicine

## 2016-08-08 ENCOUNTER — Ambulatory Visit (INDEPENDENT_AMBULATORY_CARE_PROVIDER_SITE_OTHER): Payer: BLUE CROSS/BLUE SHIELD | Admitting: Family Medicine

## 2016-08-08 ENCOUNTER — Encounter: Payer: Self-pay | Admitting: Family Medicine

## 2016-08-08 ENCOUNTER — Ambulatory Visit: Payer: BLUE CROSS/BLUE SHIELD | Admitting: Internal Medicine

## 2016-08-08 VITALS — BP 133/84 | HR 93 | Ht 66.0 in | Wt 170.0 lb

## 2016-08-08 DIAGNOSIS — M5010 Cervical disc disorder with radiculopathy, unspecified cervical region: Secondary | ICD-10-CM | POA: Diagnosis not present

## 2016-08-09 ENCOUNTER — Telehealth: Payer: Self-pay | Admitting: Family Medicine

## 2016-08-09 NOTE — Assessment & Plan Note (Signed)
2/2 cervical radiculopathy.  Not improving with prednisone dose pack x 2, physical therapy, home exercises.  Will go ahead with MRI cervical spine to further assess.  Ibuprofen or aleve if needed in meantime.  Will consider ESI following results.

## 2016-08-09 NOTE — Progress Notes (Addendum)
PCP: Kyle OraJose Paz, MD  Subjective:   HPI: Patient is a 45 y.o. male here for neck, left shoulder pain.  9/15: Patient denies known injury. States for about a month he's had intermittent problems posterior left shoulder - more constant and severe the past week. Pain level 8/10, sharp. Radiates into left arm and arm goes numb. Worse at night and first thing in the morning. No prior issues. No skin changes. No bowel/bladder dysfunction.  10/4: Patient reports he had some improvement with prednisone and physical therapy, home exercises but pain recurred. Pain level is 8/10 at base of neck, sharp. Still radiating to left shoulder and depending on neck position left arm will go numb. Does not seem to improve with anything regular. No skin changes. No bowel/bladder dysfunction.  Past Medical History:  Diagnosis Date  . ADD (attention deficit disorder)     Current Outpatient Prescriptions on File Prior to Visit  Medication Sig Dispense Refill  . amphetamine-dextroamphetamine (ADDERALL XR) 20 MG 24 hr capsule Take 1 capsule (20 mg total) by mouth every morning. 30 capsule 0  . amphetamine-dextroamphetamine (ADDERALL XR) 20 MG 24 hr capsule Take 1 capsule (20 mg total) by mouth every morning. 30 capsule 0  . ciprofloxacin (CIPRO) 500 MG tablet Take 1 tablet (500 mg total) by mouth 2 (two) times daily as needed (travelers diarrhea). 6 tablet 0  . doxycycline (VIBRAMYCIN) 100 MG capsule Take 1 capsule (100 mg total) by mouth 2 (two) times daily. 20 capsule 0  . HYDROcodone-acetaminophen (NORCO) 5-325 MG tablet Take 1 tablet by mouth every 6 (six) hours as needed for moderate pain. 30 tablet 0  . predniSONE (DELTASONE) 10 MG tablet 6 tabs po day 1, 5 tabs po day 2, 4 tabs po day 3, 3 tabs po day 4, 2 tabs po day 5, 1 tab po day 6 21 tablet 0   No current facility-administered medications on file prior to visit.     Past Surgical History:  Procedure Laterality Date  . KNEE ARTHROSCOPY     R at age 45    No Known Allergies  Social History   Social History  . Marital status: Married    Spouse name: N/A  . Number of children: 2  . Years of education: N/A   Occupational History  . furniture  Other   Social History Main Topics  . Smoking status: Never Smoker  . Smokeless tobacco: Never Used  . Alcohol use 0.0 oz/week     Comment: socially   . Drug use: No  . Sexual activity: Not on file   Other Topics Concern  . Not on file   Social History Narrative   re-married           Family History  Problem Relation Age of Onset  . CAD Neg Hx   . Diabetes Neg Hx   . Colon cancer Father     dx in his 1770s  . Prostate cancer Neg Hx   . Alcohol abuse Father     BP 133/84   Pulse 93   Ht 5\' 6"  (1.676 m)   Wt 170 lb (77.1 kg)   BMI 27.44 kg/m   Review of Systems: See HPI above.    Objective:  Physical Exam:  Gen: NAD, comfortable in exam room  Neck: No gross deformity, swelling, bruising. TTP left cervical paraspinal region.  No midline/bony TTP. FROM neck - pain flexion and left lateral rotation. BUE strength 5/5.   Sensation intact to  light touch.   2+ equal reflexes in triceps, biceps, brachioradialis tendons. Positive spurlings.  Left shoulder: No swelling, ecchymoses.  No gross deformity. No TTP except that noted above. FROM. Strength 5/5 with empty can and resisted internal/external rotation.    Assessment & Plan:  1. Left neck, shoulder pain - 2/2 cervical radiculopathy.  Not improving with prednisone dose pack x 2, physical therapy, home exercises.  Will go ahead with MRI cervical spine to further assess.  Ibuprofen or aleve if needed in meantime.  Will consider ESI following results.  Addendum:  MRI reviewed.  Results conveyed to patient while I was out of the office and he was subsequently set up for an ESI.  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

## 2016-08-10 NOTE — Telephone Encounter (Signed)
Appointment set for MRI. 

## 2016-08-11 ENCOUNTER — Ambulatory Visit (HOSPITAL_BASED_OUTPATIENT_CLINIC_OR_DEPARTMENT_OTHER): Admission: RE | Admit: 2016-08-11 | Payer: BLUE CROSS/BLUE SHIELD | Source: Ambulatory Visit

## 2016-08-11 ENCOUNTER — Other Ambulatory Visit (HOSPITAL_BASED_OUTPATIENT_CLINIC_OR_DEPARTMENT_OTHER): Payer: BLUE CROSS/BLUE SHIELD

## 2016-08-12 ENCOUNTER — Ambulatory Visit (HOSPITAL_BASED_OUTPATIENT_CLINIC_OR_DEPARTMENT_OTHER)
Admission: RE | Admit: 2016-08-12 | Discharge: 2016-08-12 | Disposition: A | Discharge status: Home / Self Care | Payer: BLUE CROSS/BLUE SHIELD | Source: Ambulatory Visit | Attending: Family Medicine | Admitting: Family Medicine

## 2016-08-12 DIAGNOSIS — M5010 Cervical disc disorder with radiculopathy, unspecified cervical region: Secondary | ICD-10-CM | POA: Insufficient documentation

## 2016-08-12 DIAGNOSIS — M47892 Other spondylosis, cervical region: Secondary | ICD-10-CM | POA: Diagnosis not present

## 2016-08-12 DIAGNOSIS — M5023 Other cervical disc displacement, cervicothoracic region: Secondary | ICD-10-CM | POA: Insufficient documentation

## 2016-08-12 DIAGNOSIS — M4802 Spinal stenosis, cervical region: Secondary | ICD-10-CM | POA: Insufficient documentation

## 2016-08-12 DIAGNOSIS — M2578 Osteophyte, vertebrae: Secondary | ICD-10-CM | POA: Diagnosis not present

## 2016-08-13 ENCOUNTER — Other Ambulatory Visit: Payer: Self-pay | Admitting: *Deleted

## 2016-08-13 MED ORDER — GABAPENTIN 300 MG PO CAPS: 300.0000 mg | ORAL_CAPSULE | Freq: Every day | ORAL | 1 refills | Status: DC

## 2016-08-14 ENCOUNTER — Other Ambulatory Visit: Payer: Self-pay | Admitting: Family Medicine

## 2016-08-14 DIAGNOSIS — M5010 Cervical disc disorder with radiculopathy, unspecified cervical region: Secondary | ICD-10-CM

## 2016-08-14 NOTE — Addendum Note (Signed)
Addended by: Kathi SimpersWISE, Pantera Winterrowd F on: 08/14/2016 11:42 AM   Modules accepted: Orders

## 2016-08-16 ENCOUNTER — Ambulatory Visit: Payer: BLUE CROSS/BLUE SHIELD | Admitting: Internal Medicine

## 2016-08-16 DIAGNOSIS — Z0289 Encounter for other administrative examinations: Secondary | ICD-10-CM

## 2016-08-17 ENCOUNTER — Ambulatory Visit
Admission: RE | Admit: 2016-08-17 | Discharge: 2016-08-17 | Disposition: A | Discharge status: Home / Self Care | Payer: BLUE CROSS/BLUE SHIELD | Source: Ambulatory Visit | Attending: Family Medicine | Admitting: Family Medicine

## 2016-08-17 ENCOUNTER — Encounter: Payer: Self-pay | Admitting: Internal Medicine

## 2016-08-17 DIAGNOSIS — M5010 Cervical disc disorder with radiculopathy, unspecified cervical region: Secondary | ICD-10-CM

## 2016-08-17 MED ORDER — IOPAMIDOL (ISOVUE-M 300) INJECTION 61%
1.0000 mL | Freq: Once | INTRAMUSCULAR | Status: AC | PRN
  Administered 2016-08-17: 1 mL via EPIDURAL

## 2016-08-17 MED ORDER — TRIAMCINOLONE ACETONIDE 40 MG/ML IJ SUSP (RADIOLOGY)
60.0000 mg | Freq: Once | INTRAMUSCULAR | Status: AC
  Administered 2016-08-17: 60 mg via EPIDURAL

## 2016-08-17 NOTE — Discharge Instructions (Signed)

## 2016-08-27 ENCOUNTER — Ambulatory Visit (INDEPENDENT_AMBULATORY_CARE_PROVIDER_SITE_OTHER): Payer: BLUE CROSS/BLUE SHIELD | Admitting: Internal Medicine

## 2016-08-27 ENCOUNTER — Telehealth: Payer: Self-pay | Admitting: Family Medicine

## 2016-08-27 ENCOUNTER — Encounter: Payer: Self-pay | Admitting: Internal Medicine

## 2016-08-27 VITALS — BP 122/78 | HR 70 | Temp 98.2°F | Resp 14 | Ht 66.0 in | Wt 178.1 lb

## 2016-08-27 DIAGNOSIS — F988 Other specified behavioral and emotional disorders with onset usually occurring in childhood and adolescence: Secondary | ICD-10-CM

## 2016-08-27 MED ORDER — AMPHETAMINE-DEXTROAMPHET ER 30 MG PO CP24: 30.0000 mg | ORAL_CAPSULE | ORAL | 0 refills | Status: DC

## 2016-08-27 NOTE — Patient Instructions (Signed)
  GO TO THE FRONT DESK Schedule your next appointment for a  Physical exam for 01-2017

## 2016-08-27 NOTE — Telephone Encounter (Signed)
Spoke to patient and will set up for 2nd ESI injection.

## 2016-08-27 NOTE — Progress Notes (Signed)
Subjective:    Patient ID: Kyle Dunn, male    DOB: 1971/08/05, 45 y.o.   MRN: [id number]  DOS:  08/27/2016 Type of visit - description : rov Interval history: ADD: More stress at work, would like to go back to a hiker dose of Adderall Developed neck pain, under the care of Kyle Dunn, chart reviewed   Review of Systems No anxiety or depression per se  Past Medical History:  Diagnosis Date  . ADD (attention deficit disorder)   . Herniated disc, cervical    x2    Past Surgical History:  Procedure Laterality Date  . KNEE ARTHROSCOPY     R at age 45    Social History   Social History  . Marital status: Married    Spouse name: N/A  . Number of children: 2  . Years of education: N/A   Occupational History  . furniture  Other   Social History Main Topics  . Smoking status: Never Smoker  . Smokeless tobacco: Never Used  . Alcohol use 0.0 oz/week     Comment: socially   . Drug use: No  . Sexual activity: Not on file   Other Topics Concern  . Not on file   Social History Narrative   re-married               Medication List       Accurate as of 08/27/16 11:52 AM. Always use your most recent med list.          amphetamine-dextroamphetamine 20 MG 24 hr capsule Commonly known as:  ADDERALL XR Take 1 capsule (20 mg total) by mouth every morning.   amphetamine-dextroamphetamine 20 MG 24 hr capsule Commonly known as:  ADDERALL XR Take 1 capsule (20 mg total) by mouth every morning.   ciprofloxacin 500 MG tablet Commonly known as:  CIPRO Take 1 tablet (500 mg total) by mouth 2 (two) times daily as needed (travelers diarrhea).   doxycycline 100 MG capsule Commonly known as:  VIBRAMYCIN Take 1 capsule (100 mg total) by mouth 2 (two) times daily.   gabapentin 300 MG capsule Commonly known as:  NEURONTIN Take 1 capsule (300 mg total) by mouth at bedtime.   HYDROcodone-acetaminophen 5-325 MG tablet Commonly known as:  NORCO Take 1  tablet by mouth every 6 (six) hours as needed for moderate pain.   predniSONE 10 MG tablet Commonly known as:  DELTASONE 6 tabs po day 1, 5 tabs po day 2, 4 tabs po day 3, 3 tabs po day 4, 2 tabs po day 5, 1 tab po day 6          Objective:   Physical Exam BP 122/78 (BP Location: Left Arm, Patient Position: Sitting, Cuff Size: Normal)   Pulse 70   Temp 98.2 F (36.8 C) (Oral)   Resp 14   Ht 5\' 6"  (1.676 m)   Wt 178 lb 2 oz (80.8 kg)   SpO2 94%   BMI 28.75 kg/m  General:   Well developed, well nourished . NAD.  HEENT:  Normocephalic . Face symmetric, atraumatic Lungs:  CTA B Normal respiratory effort, no intercostal retractions, no accessory muscle use. Heart: RRR,  no murmur.  No pretibial edema bilaterally  Skin: Not pale. Not jaundice Neurologic:  alert & oriented X3.  Speech normal, gait appropriate for age and unassisted Psych--  Cognition and judgment appear intact.  Cooperative with normal attention span and concentration.  Behavior appropriate. No anxious or depressed  appearing.      Assessment & Plan:  Assessment ADD  Plan: ADD: Currently on Adderall XL 20 mg daily, due to increase a stress at work he is unable to concentrate as much as before. No anxiety or depression per se. Would like to go back to a higher dose, we agreed to increase Adderall XL to 30 mg daily. RF as needed. Will let me know if that's not working. Radiculopathy: Under the care of Dr. Pearletha Forge, had a MRI, 1 local injection;  he asked about the timing of the second injection, recommend to discuss with sports medicine. Declined flu shot  RTC 01-2017, CPX

## 2016-08-27 NOTE — Progress Notes (Signed)
Pre visit review using our clinic review tool, if applicable. No additional management support is needed unless otherwise documented below in the visit note. 

## 2016-08-27 NOTE — Telephone Encounter (Signed)
Sounds good, thanks.

## 2016-08-27 NOTE — Telephone Encounter (Signed)
How is he doing after the shot?

## 2016-08-28 NOTE — Assessment & Plan Note (Signed)
ADD: Currently on Adderall XL 20 mg daily, due to increase a stress at work he is unable to concentrate as much as before. No anxiety or depression per se. Would like to go back to a higher dose, we agreed to increase Adderall XL to 30 mg daily. RF as needed. Will let me know if that's not working. Radiculopathy: Under the care of Dr. Pearletha ForgeHudnall, had a MRI, 1 local injection;  he asked about the timing of the second injection, recommend to discuss with sports medicine. Declined flu shot  RTC 01-2017, CPX

## 2016-08-31 ENCOUNTER — Other Ambulatory Visit: Payer: Self-pay | Admitting: Family Medicine

## 2016-08-31 DIAGNOSIS — M5412 Radiculopathy, cervical region: Secondary | ICD-10-CM

## 2016-09-07 ENCOUNTER — Ambulatory Visit
Admission: RE | Admit: 2016-09-07 | Discharge: 2016-09-07 | Disposition: A | Discharge status: Home / Self Care | Payer: BLUE CROSS/BLUE SHIELD | Source: Ambulatory Visit | Attending: Family Medicine | Admitting: Family Medicine

## 2016-09-07 DIAGNOSIS — M5412 Radiculopathy, cervical region: Secondary | ICD-10-CM

## 2016-09-07 MED ORDER — IOPAMIDOL (ISOVUE-M 300) INJECTION 61%
1.0000 mL | Freq: Once | INTRAMUSCULAR | Status: AC | PRN
  Administered 2016-09-07: 1 mL via EPIDURAL

## 2016-09-07 MED ORDER — TRIAMCINOLONE ACETONIDE 40 MG/ML IJ SUSP (RADIOLOGY)
60.0000 mg | Freq: Once | INTRAMUSCULAR | Status: AC
  Administered 2016-09-07: 60 mg via EPIDURAL

## 2016-10-05 ENCOUNTER — Telehealth: Payer: Self-pay | Admitting: Internal Medicine

## 2016-10-05 MED ORDER — AMPHETAMINE-DEXTROAMPHET ER 30 MG PO CP24: 30.0000 mg | ORAL_CAPSULE | ORAL | 0 refills | Status: DC

## 2016-10-05 NOTE — Telephone Encounter (Signed)
Rx's for December 2017 and January 2018 printed, awaiting MD signature.  

## 2016-10-05 NOTE — Telephone Encounter (Signed)
Pt is requesting refill on Adderall.  Last OV: 08/27/2016 Last Fill: 08/27/2016 #30 and 0RF UDS: 07/18/2015 Low risk  Please advise.

## 2016-10-05 NOTE — Telephone Encounter (Signed)
Relation to NG:EXBMpt:self Call back number:[phone] Pharmacy:  Reason for call:  Patient requesting a refill amphetamine-dextroamphetamine (ADDERALL XR) 30 MG 24 hr capsule

## 2016-10-05 NOTE — Telephone Encounter (Signed)
Please inform Pt that Rx's have been placed at front desk for pick up at his convenience. Thank you.  

## 2016-10-05 NOTE — Telephone Encounter (Signed)
Okay, #30, 2 prescriptions

## 2016-10-05 NOTE — Telephone Encounter (Signed)
Patient informed. 

## 2016-10-11 ENCOUNTER — Encounter: Payer: Self-pay | Admitting: Internal Medicine

## 2016-10-19 ENCOUNTER — Telehealth: Payer: Self-pay

## 2016-10-19 NOTE — Telephone Encounter (Signed)
UDS: 10/11/2016  Negative for Adderall: Usually positive   Low risk per PCP 10/19/2016

## 2016-12-10 ENCOUNTER — Telehealth: Payer: Self-pay | Admitting: Family Medicine

## 2016-12-10 NOTE — Telephone Encounter (Signed)
Yes, left ESI at C7-T1.  If he's still struggling after that I would want to see him back in the office.

## 2016-12-10 NOTE — Telephone Encounter (Signed)
Spoke to patient and told him that [facility]

## 2016-12-11 ENCOUNTER — Other Ambulatory Visit: Payer: Self-pay | Admitting: Family Medicine

## 2016-12-11 DIAGNOSIS — M5412 Radiculopathy, cervical region: Secondary | ICD-10-CM

## 2016-12-14 ENCOUNTER — Telehealth: Payer: Self-pay | Admitting: Internal Medicine

## 2016-12-14 MED ORDER — AMPHETAMINE-DEXTROAMPHET ER 30 MG PO CP24: 30.0000 mg | ORAL_CAPSULE | ORAL | 0 refills | Status: DC

## 2016-12-14 NOTE — Telephone Encounter (Signed)
Okay for prescriptions 3

## 2016-12-14 NOTE — Telephone Encounter (Signed)
Relation to ZO:XWRUpt:self Call back number:[phone]  Reason for call:  Patient requesting a refill amphetamine-dextroamphetamine (ADDERALL XR) 30 MG 24 hr capsule

## 2016-12-14 NOTE — Telephone Encounter (Signed)
Please inform Pt that Rx's have been placed at front desk for pick up. Thank you.  

## 2016-12-14 NOTE — Telephone Encounter (Signed)
Pt is requesting refill on Adderall.  Last OV: 08/27/2016 Last Fill: 10/05/2016 #30 and 0RF (For December 2017 and January 2018) UDS: 10/11/2016 Low risk  Please advise.

## 2016-12-14 NOTE — Telephone Encounter (Signed)
Rx's for February, March, and April 2018 printed, awaiting MD signature.

## 2016-12-14 NOTE — Telephone Encounter (Signed)
lvm advising patient of message below °

## 2016-12-18 ENCOUNTER — Other Ambulatory Visit: Payer: BLUE CROSS/BLUE SHIELD

## 2016-12-19 ENCOUNTER — Ambulatory Visit
Admission: RE | Admit: 2016-12-19 | Discharge: 2016-12-19 | Disposition: A | Discharge status: Home / Self Care | Payer: BLUE CROSS/BLUE SHIELD | Source: Ambulatory Visit | Attending: Family Medicine | Admitting: Family Medicine

## 2016-12-19 DIAGNOSIS — M5412 Radiculopathy, cervical region: Secondary | ICD-10-CM

## 2016-12-19 MED ORDER — IOPAMIDOL (ISOVUE-M 300) INJECTION 61%
1.0000 mL | Freq: Once | INTRAMUSCULAR | Status: AC | PRN
  Administered 2016-12-19: 1 mL via EPIDURAL

## 2016-12-19 MED ORDER — TRIAMCINOLONE ACETONIDE 40 MG/ML IJ SUSP (RADIOLOGY)
60.0000 mg | Freq: Once | INTRAMUSCULAR | Status: AC
  Administered 2016-12-19: 60 mg via EPIDURAL

## 2017-01-28 ENCOUNTER — Telehealth: Payer: Self-pay

## 2017-01-28 ENCOUNTER — Encounter: Payer: BLUE CROSS/BLUE SHIELD | Admitting: Internal Medicine

## 2017-01-28 NOTE — Telephone Encounter (Signed)
No dismissal process for now, is ok

## 2017-01-28 NOTE — Telephone Encounter (Signed)
Multiple no shows/cancellations (see below)  01/28/2017-cpe-no show 08/16/2016-follow-up-no show 08/08/2016-follow-up- cancel 08/01/2016-follow-up-cancel 01/30/2016-follow-up- no show 01/24/2016-follow-up-cancel 10/26/2015-cpe-cancel   Would you like to begin dismissal process?

## 2017-02-05 ENCOUNTER — Encounter: Payer: Self-pay | Admitting: Internal Medicine

## 2017-03-18 ENCOUNTER — Telehealth: Payer: Self-pay | Admitting: Internal Medicine

## 2017-03-18 NOTE — Telephone Encounter (Signed)
Caller name: Linus Salmonsric Goslin Relationship to patient: self Can be reached: [phone]  Reason for call: pt called for refill on adderall and has 3-4 left and taking 1/day most days. Usually doesn't take weekends. Please call when ready.

## 2017-03-18 NOTE — Telephone Encounter (Signed)
LM to notify pt to call and schedule appt. Currently 2 cpe cancellations for Dr. Drue NovelPaz this week if they work for pt, otherwise ok to schedule routine appt.

## 2017-03-18 NOTE — Telephone Encounter (Signed)
Pt has not been seen since 08/2016, needs appt before refills can be given.

## 2017-03-18 NOTE — Telephone Encounter (Signed)
Pt called for refill today and states that he travels short notice quite often for work. He said he left VM Friday before appt stating he would not be able to make it in.

## 2017-03-22 ENCOUNTER — Ambulatory Visit (INDEPENDENT_AMBULATORY_CARE_PROVIDER_SITE_OTHER): Payer: BLUE CROSS/BLUE SHIELD | Admitting: Internal Medicine

## 2017-03-22 ENCOUNTER — Encounter: Payer: Self-pay | Admitting: Internal Medicine

## 2017-03-22 VITALS — BP 132/80 | HR 88 | Temp 98.2°F | Ht 66.0 in | Wt 179.0 lb

## 2017-03-22 DIAGNOSIS — F988 Other specified behavioral and emotional disorders with onset usually occurring in childhood and adolescence: Secondary | ICD-10-CM | POA: Diagnosis not present

## 2017-03-22 MED ORDER — AMPHETAMINE-DEXTROAMPHET ER 30 MG PO CP24: 30.0000 mg | ORAL_CAPSULE | ORAL | 0 refills | Status: DC

## 2017-03-22 NOTE — Patient Instructions (Signed)
   GO TO THE FRONT DESK Schedule your next appointment for a  Physical in 5-6 months , fasting

## 2017-03-22 NOTE — Progress Notes (Signed)
   Subjective:    Patient ID: Kyle Dunn, male    DOB: Oct 02, 1971, 46 y.o.   MRN: [id number]  DOS:  03/22/2017 Type of visit - description : rov Interval history: In general feeling well. Good compliance with Adderall, good control of sx . Had radiculopathy, symptoms resolved after the third injection of steroids.   Review of Systems Doing great, back exercising. No anxiety or depression although he has been under a lot of stress at work.   Past Medical History:  Diagnosis Date  . ADD (attention deficit disorder)   . Herniated disc, cervical    x2    Past Surgical History:  Procedure Laterality Date  . KNEE ARTHROSCOPY     R at age 46    Social History   Social History  . Marital status: Married    Spouse name: N/A  . Number of children: 2  . Years of education: N/A   Occupational History  . furniture  Other   Social History Main Topics  . Smoking status: Never Smoker  . Smokeless tobacco: Never Used  . Alcohol use 0.0 oz/week     Comment: socially   . Drug use: No  . Sexual activity: Not on file   Other Topics Concern  . Not on file   Social History Narrative   re-married             Allergies as of 03/22/2017   No Known Allergies     Medication List       Accurate as of 03/22/17 11:59 PM. Always use your most recent med list.          amphetamine-dextroamphetamine 30 MG 24 hr capsule Commonly known as:  ADDERALL XR Take 1 capsule (30 mg total) by mouth every morning.          Objective:   Physical Exam BP 132/80   Pulse 88   Temp 98.2 F (36.8 C) (Oral)   Ht 5\' 6"  (1.676 m)   Wt 179 lb (81.2 kg)   SpO2 98%   BMI 28.89 kg/m  General:   Well developed, well nourished . NAD.  HEENT:  Normocephalic . Face symmetric, atraumatic Lungs:  CTA B Normal respiratory effort, no intercostal retractions, no accessory muscle use. Heart: RRR,  no murmur.  No pretibial edema bilaterally  Skin: Not pale. Not  jaundice Neurologic:  alert & oriented X3.  Speech normal, gait appropriate for age and unassisted Psych--  Cognition and judgment appear intact.  Cooperative with normal attention span and concentration.  Behavior appropriate. No anxious or depressed appearing.      Assessment & Plan:   Assessment ADD  PLAN ADD: Refill medications, doing great Radiculopathy: Symptoms resolved after injections 3 RTC 5-6 months for a CPX,  fasting

## 2017-03-24 NOTE — Assessment & Plan Note (Signed)
ADD: Refill medications, doing great Radiculopathy: Symptoms resolved after injections 3 RTC 5-6 months for a CPX,  fasting

## 2017-05-27 ENCOUNTER — Telehealth: Payer: Self-pay | Admitting: Internal Medicine

## 2017-05-27 NOTE — Telephone Encounter (Signed)
Caller name: Cristal DeerChristopher Relation to pt: self Call back number: [phone] Pharmacy:  Reason for call: Pt is requesting rx on amphetamine-dextroamphetamine (ADDERALL XR) 30 MG 24 hr capsule ( last 03/22/2017). Please advise.

## 2017-05-28 MED ORDER — AMPHETAMINE-DEXTROAMPHET ER 30 MG PO CP24: 30.0000 mg | ORAL_CAPSULE | ORAL | 0 refills | Status: DC

## 2017-05-28 NOTE — Telephone Encounter (Signed)
LMOM informing Pt that Rx's have been placed at front desk for pick up at his convenience.  

## 2017-05-28 NOTE — Telephone Encounter (Signed)
Rx's for July and August 2018 printed, awaiting MD signature.  

## 2017-05-28 NOTE — Telephone Encounter (Signed)
Pt is requesting refill on Adderall XR.   Last OV: 03/22/2017 Last Fill: 03/22/2017 #30 and 0RF For May and June 2018 UDS: 10/11/2016 Low risk  [place] Controlled Substance Contract printed; no issues noted.  Please advise.

## 2017-05-28 NOTE — Telephone Encounter (Signed)
Ok Rx x 2 

## 2017-07-09 ENCOUNTER — Ambulatory Visit (INDEPENDENT_AMBULATORY_CARE_PROVIDER_SITE_OTHER): Payer: BLUE CROSS/BLUE SHIELD | Admitting: Sports Medicine

## 2017-07-09 ENCOUNTER — Ambulatory Visit
Admission: RE | Admit: 2017-07-09 | Discharge: 2017-07-09 | Disposition: A | Discharge status: Home / Self Care | Payer: BLUE CROSS/BLUE SHIELD | Source: Ambulatory Visit | Attending: Sports Medicine | Admitting: Sports Medicine

## 2017-07-09 ENCOUNTER — Ambulatory Visit: Payer: Self-pay

## 2017-07-09 ENCOUNTER — Encounter: Payer: Self-pay | Admitting: Sports Medicine

## 2017-07-09 VITALS — BP 132/80 | Ht 66.0 in | Wt 170.0 lb

## 2017-07-09 DIAGNOSIS — M25571 Pain in right ankle and joints of right foot: Secondary | ICD-10-CM

## 2017-07-09 DIAGNOSIS — M79671 Pain in right foot: Secondary | ICD-10-CM | POA: Diagnosis not present

## 2017-07-09 DIAGNOSIS — M66871 Spontaneous rupture of other tendons, right ankle and foot: Secondary | ICD-10-CM

## 2017-07-09 NOTE — Progress Notes (Signed)
Chief complaint: Right foot pain 3 months  History of present illness: Kyle Dunn is a 46 year old male who presents to the sports medicine office today with chief complaint of right foot pain. He reports that symptoms have been present for approximately 3 months. He points to the medial arch of his right foot as were the pain is at. He reports that he was running approximately 3 months ago, was running uphill, notice a slight twinge in ache in his right medial foot, reports that he stopped and walk the rest of the way home. He does have history of over pronation, reports that he does have orthotics, but on this particular run he did not use the orthotics. He reports when he got home he noticed pain increasing along the medial arch. He reports that this has been limiting him running, over the last 2-3 weeks has been limiting him from even biking. He reports over the last 2 weeks noticing swelling in this region. He reports pain with climbing uphill or climbing up stairs, not to much going down stairs or going down hill. He reports pain with any type of weightbearing or standing. Does not report of any previous injury to his right foot. He does not report of any numbness, tingling, or weakness. He does have history of left posterior tibialis tendon partial rupture approximately 5 years ago, reports that symptoms feel similar on this side. Today, he reports the pain as an 8/10, describes it as a sharp throbbing pain. He reports that he has tried Advil, with no relief in symptoms.  Past medical history, surgical history, family history, social history obtained and reviewed, notable history includes history of patellar tendinitis of right knee, history of partial tear of left posterior tibialis tendon, does not report of any current tobacco use.  Allergies: No known drug allergies  Review of systems: As stated above  Physical exam: Vital signs reviewed and are documented in chart General: Alert, oriented,  appears stated age, in no apparent distress HEENT: Moist oral mucosa Respiratory: Normal respirations, able to speak in full sentences Cardiac: Normal peripheral pulses, regular rate Integumentary: No rashes on visible skin Neurologic: Strength 5/5 in bilateral lower extremities, sensation 2+ in bilateral lower extremities Psychiatric: Appropriate affect and mood Musculoskeletal: Inspection of his right foot reveals slight effusion along medial arch, no warmth, erythema, or ecchymosis noted, he is tender to palpation along the medial arch of right foot near the navicular and where the posterior tibialis tendon attaches to the navicular, inspection also reveals that he does over pronate both of his feet, he does have pain with dorsiflexion and eversion, both active and passively, can see him grimacing pain with weightbearing, anterior drawer and talar tilt negative, overall, he does have full range of motion of ankles  Limited musculoskeletal ultrasound: Limited ultrasound was done of right foot today on the medial side evaluating the posterior tibialis tendon. On short axis view do see partial tearing of the posterior tibialis tendon, do see some fibers of the tendon revealing that he does not have a full tear of the tendon. He does have significant effusion noted surrounding the posterior tibialis tendon. 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  Assessment and plan: 1. Right medial foot pain, secondary to partial tear of right posterior tibialis tendon 2. B/L pes planus with overpronation  Right medial foot pain -Discussed with patient ultrasound  findings of partial tear of right posterior tibialis tendon, discussed that treatment is not surgical at this point given that is not full thickness tear -Discussed being completely shut down for the next 3  weeks him up meaning no running, he can bike if he has no pain -Discussed nitroglycerin patch, to apply one fourth daily, rotating on daily basis, discussed side effects of headache -Will also order for x-ray today for evaluation of accessory navicular, as this could play a possible role in partial tearing of the posterior tibialis tendon -Will place patient in body Helix, X brace on left, full brace on right  Scaphoid padding was applied to 2 separate pairs of older orthotics today. Discussed repeat custom orthotics once the above issue as resolved.  Will follow-up in 3-4 weeks, with plan for repeat ultrasound at that time, or sooner as needed.   Kyle Kernshristopher Reona Zendejas, MD Primary Care Sports Medicine Fellow [facility]

## 2017-07-09 NOTE — Patient Instructions (Addendum)
Nitroglycerin Protocol   Apply 1/4 nitroglycerin patch to affected area daily.  Change position of patch within the affected area every 24 hours.  You may experience a headache during the first 1-2 weeks of using the patch, these should subside.  If you experience headaches after beginning nitroglycerin patch treatment, you may take your preferred over the counter pain reliever.  Another side effect of the nitroglycerin patch is skin irritation or rash related to patch adhesive.  Please notify our office if you develop more severe headaches or rash, and stop the patch.  Tendon healing with nitroglycerin patch may require 12 to 24 weeks depending on the extent of injury.  Men should not use if taking Viagra, Cialis, or Levitra.   Do not use if you have migraines or rosacea.    Follow-up with us in 3-4 weeks

## 2017-07-10 ENCOUNTER — Other Ambulatory Visit: Payer: Self-pay | Admitting: *Deleted

## 2017-07-10 MED ORDER — NITROGLYCERIN 0.2 MG/HR TD PT24: MEDICATED_PATCH | TRANSDERMAL | 1 refills | Status: DC

## 2017-07-10 NOTE — Addendum Note (Signed)
Addended by: Annita BrodMOORE, Jodette Wik C on: 07/10/2017 08:41 AM   Modules accepted: Orders

## 2017-07-30 ENCOUNTER — Ambulatory Visit: Payer: BLUE CROSS/BLUE SHIELD | Admitting: Sports Medicine

## 2017-08-07 ENCOUNTER — Telehealth: Payer: Self-pay | Admitting: Internal Medicine

## 2017-08-07 MED ORDER — AMPHETAMINE-DEXTROAMPHET ER 30 MG PO CP24: 30.0000 mg | ORAL_CAPSULE | ORAL | 0 refills | Status: DC

## 2017-08-07 NOTE — Telephone Encounter (Signed)
Rx's for October and November 2018 printed, awaiting MD signature.  

## 2017-08-07 NOTE — Telephone Encounter (Signed)
LMOM informing Pt that Rx's have been placed at the front desk for pick up at his convenience.

## 2017-08-07 NOTE — Telephone Encounter (Signed)
Okay prescriptions 2 

## 2017-08-07 NOTE — Telephone Encounter (Signed)
Caller name: Vannak Montenegro Relation to pt: self  Call back number:[phone] Pharmacy:  Reason for call: Pt requesting refill on amphetamine-dextroamphetamine (ADDERALL XR) 30 MG 24 hr capsule. Please advise.

## 2017-08-07 NOTE — Telephone Encounter (Signed)
Pt is requesting refill on Adderall XR .  Last OV: 03/22/2017 Last Fill: 05/28/2017 #30 and 0RF For July and August 2018 UDS: 10/11/2016 Low risk  [place] printed; no issues noted  Please advise.

## 2017-08-22 ENCOUNTER — Encounter: Payer: BLUE CROSS/BLUE SHIELD | Admitting: Internal Medicine

## 2017-08-22 DIAGNOSIS — Z0289 Encounter for other administrative examinations: Secondary | ICD-10-CM

## 2017-09-04 ENCOUNTER — Ambulatory Visit (INDEPENDENT_AMBULATORY_CARE_PROVIDER_SITE_OTHER): Payer: BLUE CROSS/BLUE SHIELD | Admitting: Internal Medicine

## 2017-09-04 ENCOUNTER — Encounter: Payer: Self-pay | Admitting: Internal Medicine

## 2017-09-04 VITALS — BP 122/68 | HR 68 | Temp 97.7°F | Resp 14 | Ht 66.0 in | Wt 178.1 lb

## 2017-09-04 DIAGNOSIS — F988 Other specified behavioral and emotional disorders with onset usually occurring in childhood and adolescence: Secondary | ICD-10-CM

## 2017-09-04 DIAGNOSIS — E785 Hyperlipidemia, unspecified: Secondary | ICD-10-CM

## 2017-09-04 DIAGNOSIS — Z0000 Encounter for general adult medical examination without abnormal findings: Secondary | ICD-10-CM

## 2017-09-04 LAB — COMPREHENSIVE METABOLIC PANEL
ALT: 28 U/L (ref 0–53)
AST: 27 U/L (ref 0–37)
Albumin: 4.6 g/dL (ref 3.5–5.2)
Alkaline Phosphatase: 39 U/L (ref 39–117)
BUN: 20 mg/dL (ref 6–23)
CALCIUM: 9.9 mg/dL (ref 8.4–10.5)
CHLORIDE: 99 meq/L (ref 96–112)
CO2: 32 meq/L (ref 19–32)
Creatinine, Ser: 1 mg/dL (ref 0.40–1.50)
GFR: 84.97 mL/min (ref >60.00)
Glucose, Bld: 86 mg/dL (ref 70–99)
POTASSIUM: 4.8 meq/L (ref 3.5–5.1)
Sodium: 138 mEq/L (ref 135–145)
Total Bilirubin: 0.6 mg/dL (ref 0.2–1.2)
Total Protein: 7.6 g/dL (ref 6.0–8.3)

## 2017-09-04 LAB — LIPID PANEL
CHOL/HDL RATIO: 6
CHOLESTEROL: 278 mg/dL — AB (ref 0–200)
HDL: 46.2 mg/dL (ref >39.00)
LDL CALC: 207 mg/dL — AB (ref 0–99)
NonHDL: 231.88
TRIGLYCERIDES: 123 mg/dL (ref 0.0–149.0)
VLDL: 24.6 mg/dL (ref 0.0–40.0)

## 2017-09-04 NOTE — Progress Notes (Signed)
Subjective:    Patient ID: Kyle Dunn, male    DOB: Jun 25, 1971, 46 y.o.   MRN: [id number]  DOS:  09/04/2017 Type of visit - description : cpx Interval history: In general doing well.   Review of Systems Has  lower extremity tendinitis, sees Dr. Darrick PennaFields. Left  cervical radiculopathy: Still have soreness now and then and occasional tingling in the left hand.  Other than above, a 14 point review of systems is negative     Past Medical History:  Diagnosis Date  . ADD (attention deficit disorder)   . Herniated disc, cervical    x2    Past Surgical History:  Procedure Laterality Date  . KNEE ARTHROSCOPY     R at age 46    Social History   Social History  . Marital status: Married    Spouse name: N/A  . Number of children: 2  . Years of education: N/A   Occupational History  . furniture  Other   Social History Main Topics  . Smoking status: Never Smoker  . Smokeless tobacco: Never Used  . Alcohol use 0.0 oz/week     Comment: socially   . Drug use: No  . Sexual activity: Not on file   Other Topics Concern  . Not on file   Social History Narrative   re-married            Family History  Problem Relation Age of Onset  . Colon cancer Father        dx in his 2470s  . Alcohol abuse Father   . CAD Neg Hx   . Diabetes Neg Hx   . Prostate cancer Neg Hx      Allergies as of 09/04/2017   No Known Allergies     Medication List       Accurate as of 09/04/17 11:59 PM. Always use your most recent med list.          amphetamine-dextroamphetamine 30 MG 24 hr capsule Commonly known as:  ADDERALL XR Take 1 capsule (30 mg total) by mouth every morning.   amphetamine-dextroamphetamine 30 MG 24 hr capsule Commonly known as:  ADDERALL XR Take 1 capsule (30 mg total) by mouth every morning.   nitroGLYCERIN 0.2 mg/hr patch Commonly known as:  NITRODUR - Dosed in mg/24 hr Use 1/4 patch daily to the affected area          Objective:   Physical Exam BP 122/68 (BP Location: Left Arm, Patient Position: Sitting, Cuff Size: Small)   Pulse 68   Temp 97.7 F (36.5 C) (Oral)   Resp 14   Ht 5\' 6"  (1.676 m)   Wt 178 lb 2 oz (80.8 kg)   SpO2 97%   BMI 28.75 kg/m    General:   Well developed, well nourished . NAD.  Neck: No  thyromegaly  HEENT:  Normocephalic . Face symmetric, atraumatic Lungs:  CTA B Normal respiratory effort, no intercostal retractions, no accessory muscle use. Heart: RRR,  no murmur.  No pretibial edema bilaterally  Abdomen:  Not distended, soft, non-tender. No rebound or rigidity.   Skin: Exposed areas without rash. Not pale. Not jaundice Neurologic:  alert & oriented X3.  Speech normal, gait appropriate for age and unassisted Strength symmetric and appropriate for age.  Motor symmetric, DTRs symmetric. Psych: Cognition and judgment appear intact.  Cooperative with normal attention span and concentration.  Behavior appropriate. No anxious or depressed appearing.    Assessment & Plan:  Assessment ADD  PLAN ADD: Well-controlled, UDS and contract today. RF prn LUE radiculopathy :  Still has some soreness at the left posterior shoulder and occasional paresthesias in the left upper extremity. Symptoms are not severe, able to do all his activities. We agree on observation but prompt reevaluation if sx become persistent . RTC 8 months

## 2017-09-04 NOTE — Patient Instructions (Signed)
GO TO THE LAB : Get the blood work     GO TO THE FRONT DESK Schedule your next appointment for a  regular checkup in 8 months

## 2017-09-04 NOTE — Assessment & Plan Note (Addendum)
-  Td 2014; flu shot  -NFA:OZHYQCCS:Never had a cscope , father had colon ca dx  in his 6470s, no other family members affected, no FH of colon polyps . Will get a colonoscopy at age 46 -prostate ca screening : start at age 46 -He has a very healthy lifestyle. -Labs: CMP, FLP, UDS

## 2017-09-04 NOTE — Progress Notes (Signed)
Pre visit review using our clinic review tool, if applicable. No additional management support is needed unless otherwise documented below in the visit note. 

## 2017-09-05 NOTE — Assessment & Plan Note (Signed)
ADD: Well-controlled, UDS and contract today. RF prn LUE radiculopathy :  Still has some soreness at the left posterior shoulder and occasional paresthesias in the left upper extremity. Symptoms are not severe, able to do all his activities. We agree on observation but prompt reevaluation if sx become persistent . RTC 8 months

## 2017-09-07 LAB — PAIN MGMT, PROFILE 8 W/CONF, U
6 ACETYLMORPHINE: NEGATIVE ng/mL (ref <10)
AMPHETAMINE: 1235 ng/mL — AB (ref <250)
Alcohol Metabolites: POSITIVE ng/mL — AB (ref <500)
Amphetamines: POSITIVE ng/mL — AB (ref <500)
BENZODIAZEPINES: NEGATIVE ng/mL (ref <100)
BUPRENORPHINE, URINE: NEGATIVE ng/mL (ref <5)
CREATININE: 73.4 mg/dL
Cocaine Metabolite: NEGATIVE ng/mL (ref <150)
Ethyl Glucuronide (ETG): 6843 ng/mL — ABNORMAL HIGH (ref <500)
Ethyl Sulfate (ETS): 1458 ng/mL — ABNORMAL HIGH (ref <100)
MARIJUANA METABOLITE: NEGATIVE ng/mL (ref <20)
MDMA: NEGATIVE ng/mL (ref <500)
Methamphetamine: NEGATIVE ng/mL (ref <250)
OPIATES: NEGATIVE ng/mL (ref <100)
Oxidant: NEGATIVE ug/mL (ref <200)
Oxycodone: NEGATIVE ng/mL (ref <100)
pH: 7.01 (ref 4.5–9.0)

## 2017-09-09 MED ORDER — ATORVASTATIN CALCIUM 20 MG PO TABS: 20.0000 mg | ORAL_TABLET | Freq: Every day | ORAL | 3 refills | Status: DC

## 2017-09-09 NOTE — Addendum Note (Signed)
Addended byConrad [place]: Jazalyn Mondor D on: 09/09/2017 05:26 PM   Modules accepted: Orders

## 2017-10-10 ENCOUNTER — Telehealth: Payer: Self-pay | Admitting: Internal Medicine

## 2017-10-10 MED ORDER — AMPHETAMINE-DEXTROAMPHET ER 30 MG PO CP24: 30.0000 mg | ORAL_CAPSULE | ORAL | 0 refills | Status: DC

## 2017-10-10 NOTE — Telephone Encounter (Signed)
Spoke w/ Pt, made him aware of electronic Rxing- [facility] [address] in [place].

## 2017-10-10 NOTE — Telephone Encounter (Signed)
RX sent

## 2017-10-10 NOTE — Telephone Encounter (Signed)
Copied from CRM [phone]. Topic: Quick Communication - See Telephone Encounter >> Oct 10, 2017  8:28 AM Eston Mouldavis, Janeisha Ryle B wrote: CRM for notification. See Telephone encounter for:  Refill adderall

## 2017-10-10 NOTE — Telephone Encounter (Signed)
Pt requesting  A  Refill of  adderall

## 2017-11-05 DIAGNOSIS — M542 Cervicalgia: Secondary | ICD-10-CM

## 2017-11-05 HISTORY — DX: Cervicalgia: M54.2

## 2017-11-11 ENCOUNTER — Telehealth: Payer: Self-pay | Admitting: Internal Medicine

## 2017-11-11 NOTE — Telephone Encounter (Signed)
2 months of prescriptions sent on 10/10/2017.

## 2017-11-11 NOTE — Telephone Encounter (Signed)
Copied from CRM [id number]. Topic: Quick Communication - See Telephone Encounter >> Nov 11, 2017  9:04 AM Guinevere FerrariMorris, Jane Broughton E, NT wrote: CRM for notification. See Telephone encounter for: Pt called and said he was supposed to have a prescription called in for  amphetamine-dextroamphetamine (ADDERALL XR) 30 MG 24 hr capsule. Pt uses [phone] - [place], [place] - [address] [address]  11/11/17.

## 2017-11-12 ENCOUNTER — Ambulatory Visit: Payer: BLUE CROSS/BLUE SHIELD | Admitting: Family Medicine

## 2017-12-01 ENCOUNTER — Telehealth: Payer: Self-pay | Admitting: Internal Medicine

## 2017-12-01 NOTE — Telephone Encounter (Signed)
Due for AST ALT FLP, please arrange

## 2017-12-02 NOTE — Telephone Encounter (Signed)
Letter mailed to Pt instructed to call office to schedule lab appt.

## 2017-12-17 ENCOUNTER — Telehealth: Payer: Self-pay | Admitting: Internal Medicine

## 2017-12-17 MED ORDER — AMPHETAMINE-DEXTROAMPHET ER 30 MG PO CP24: 30.0000 mg | ORAL_CAPSULE | ORAL | 0 refills | Status: DC

## 2017-12-17 NOTE — Telephone Encounter (Signed)
Rx's pended below.

## 2017-12-17 NOTE — Telephone Encounter (Signed)
Ok, please get ready the 2 RXs

## 2017-12-17 NOTE — Telephone Encounter (Signed)
Please advise Adderall Rx?  Last RX: 10/10/17, #30 Last OV: 09/04/17 Next OV: 04/07/18 UDS: 09/04/17 CSC: 09/04/17  CSR: No discrepancies identified

## 2017-12-17 NOTE — Telephone Encounter (Signed)
Sent!

## 2017-12-17 NOTE — Telephone Encounter (Signed)
Copied from CRM [phone]. Topic: Quick Communication - Rx Refill/Question >> Dec 17, 2017  8:12 AM Cecelia ByarsGreen, Birney Belshe L, RMA wrote: Medication: amphetamine-dextroamphetamine (ADDERALL XR) 30 MG 24 hr capsule  Has the patient contacted their pharmacy? Yes  (Agent: If no, request that the patient contact the pharmacy for the refill.) Preferred Pharmacy (with phone number or street name): [facility] [address] [place] Agent: Please be advised that RX refills may take up to 3 business days. We ask that you follow-up with your pharmacy.

## 2017-12-30 ENCOUNTER — Telehealth: Payer: Self-pay | Admitting: Family Medicine

## 2017-12-30 MED ORDER — GABAPENTIN 300 MG PO CAPS: 300.0000 mg | ORAL_CAPSULE | Freq: Every day | ORAL | 2 refills | Status: DC

## 2017-12-30 MED ORDER — PREDNISONE 10 MG PO TABS: ORAL_TABLET | ORAL | 0 refills | Status: DC

## 2017-12-30 NOTE — Telephone Encounter (Signed)
Patient called back to see if he can get the prednisone prescription refilled as well. He spoke with his physical therapist, [place], and she suggested prednisone may be beneficial as well.

## 2017-12-30 NOTE — Telephone Encounter (Signed)
Patient called requesting an appointment today to follow up on a pinched nerve. He was informed provider is out of the office sick and he was scheduled for Wednesday.  Patient states he is having the same symptoms in his neck and shoulder region as he did in 2017. He wants another ESI but they informed him he has to have an order from the physician.   Patient states he is in a lot of pain and requested medication that was previously prescribed for the pinched nerve. He does not remember the name but states it was a little yellow tablet.

## 2017-12-30 NOTE — Telephone Encounter (Signed)
Sent that in as well - please let him know.  Thanks!

## 2017-12-30 NOTE — Telephone Encounter (Signed)
Spoke to patient and he said it was the gabapentin.  [facility] [address] [place]

## 2017-12-30 NOTE — Telephone Encounter (Signed)
Ok rx sent in.  Thanks!

## 2017-12-30 NOTE — Telephone Encounter (Signed)
So it looks like we prescribed two different medicines: prednisone (which would be most likely - you take 6 first day, 5 the next, etc) and gabapentin which was at bedtime.  Does he remember which one?  The color unfortunately doesn't help us that much as different companies make pills with different colors and sizes of the same prescription.

## 2018-01-01 ENCOUNTER — Encounter: Payer: Self-pay | Admitting: Family Medicine

## 2018-01-01 ENCOUNTER — Other Ambulatory Visit: Payer: Self-pay | Admitting: Family Medicine

## 2018-01-01 ENCOUNTER — Ambulatory Visit (INDEPENDENT_AMBULATORY_CARE_PROVIDER_SITE_OTHER): Payer: No Typology Code available for payment source | Admitting: Family Medicine

## 2018-01-01 DIAGNOSIS — M5010 Cervical disc disorder with radiculopathy, unspecified cervical region: Secondary | ICD-10-CM | POA: Diagnosis not present

## 2018-01-01 MED ORDER — TIZANIDINE HCL 4 MG PO TABS: 4.0000 mg | ORAL_TABLET | Freq: Four times a day (QID) | ORAL | 1 refills | Status: DC | PRN

## 2018-01-01 NOTE — Patient Instructions (Addendum)
You have cervical radiculopathy (a pinched nerve in the neck). Finish the prednisone as directed. Day AFTER finishing this you can start aleve or ibuprofen but not until then. Tizanidine as needed for muscle spasms - don't take during the day if it makes you sleepy. Topical capsaicin may help for the pain as well if you take up to 4 times a day. Boswellia extract, curcumin, pycnogenol were the medicines that may help for arthritis. We will set you up with a cortisone injection - call me a week after this to let me know how  you're doing. Consider cervical collar. Simple range of motion exercises within limits of pain to prevent further stiffness. Consider physical therapy for stretching, exercises, traction, and modalities. Heat 15 minutes at a time 3-4 times a day to help with spasms. Watch head position when on computers, texting, when sleeping in bed - should in line with back to prevent further nerve traction and irritation. Call me in a week to let me know how you're doing.

## 2018-01-02 ENCOUNTER — Other Ambulatory Visit: Payer: Self-pay | Admitting: Family Medicine

## 2018-01-02 ENCOUNTER — Encounter: Payer: Self-pay | Admitting: Family Medicine

## 2018-01-02 DIAGNOSIS — M5412 Radiculopathy, cervical region: Secondary | ICD-10-CM

## 2018-01-02 NOTE — Progress Notes (Signed)
PCP: Wanda Plump, MD  Subjective:   HPI: Patient is a 47 y.o. male here for neck, left shoulder pain.  9/15: Patient denies known injury. States for about a month he's had intermittent problems posterior left shoulder - more constant and severe the past week. Pain level 8/10, sharp. Radiates into left arm and arm goes numb. Worse at night and first thing in the morning. No prior issues. No skin changes. No bowel/bladder dysfunction.  08/08/16: Patient reports he had some improvement with prednisone and physical therapy, home exercises but pain recurred. Pain level is 8/10 at base of neck, sharp. Still radiating to left shoulder and depending on neck position left arm will go numb. Does not seem to improve with anything regular. No skin changes. No bowel/bladder dysfunction.  01/01/18: Patient reports over the past couple months he started to get increased problems with pain on left side of his neck. Locked up back in December with more stress at work. He also felt a twinge here when helping with moving a sofa. Has been to PT weekly. Pain level was 10/10 and sharp with radiation into left arm with numbness, same distribution as previously. His pain has improved to 7/10 with prednisone and gabapentin. Hasn't been able to exercise due to pain. No bowel/bladder dysfunction.  Past Medical History:  Diagnosis Date  . ADD (attention deficit disorder)   . Herniated disc, cervical    x2    Current Outpatient Medications on File Prior to Visit  Medication Sig Dispense Refill  . amphetamine-dextroamphetamine (ADDERALL XR) 30 MG 24 hr capsule Take 1 capsule (30 mg total) by mouth every morning. 30 capsule 0  . amphetamine-dextroamphetamine (ADDERALL XR) 30 MG 24 hr capsule Take 1 capsule (30 mg total) by mouth every morning. 30 capsule 0  . atorvastatin (LIPITOR) 20 MG tablet Take 1 tablet (20 mg total) at bedtime by mouth. 30 tablet 3  . gabapentin (NEURONTIN) 300 MG capsule Take 1  capsule (300 mg total) by mouth at bedtime. 30 capsule 2  . nitroGLYCERIN (NITRODUR - DOSED IN MG/24 HR) 0.2 mg/hr patch Use 1/4 patch daily to the affected area 30 patch 1  . predniSONE (DELTASONE) 10 MG tablet 6 tabs po day 1, 5 tabs po day 2, 4 tabs po day 3, 3 tabs po day 4, 2 tabs po day 5, 1 tab po day 6 21 tablet 0   No current facility-administered medications on file prior to visit.     Past Surgical History:  Procedure Laterality Date  . KNEE ARTHROSCOPY     R at age 51    No Known Allergies  Social History   Socioeconomic History  . Marital status: Married    Spouse name: Not on file  . Number of children: 2  . Years of education: Not on file  . Highest education level: Not on file  Social Needs  . Financial resource strain: Not on file  . Food insecurity - worry: Not on file  . Food insecurity - inability: Not on file  . Transportation needs - medical: Not on file  . Transportation needs - non-medical: Not on file  Occupational History  . Occupation: Ecologist: [employer]  Tobacco Use  . Smoking status: Never Smoker  . Smokeless tobacco: Never Used  Substance and Sexual Activity  . Alcohol use: Yes    Alcohol/week: 0.0 oz    Comment: socially   . Drug use: No  . Sexual activity: Not  on file  Other Topics Concern  . Not on file  Social History Narrative   re-married        Family History  Problem Relation Age of Onset  . Colon cancer Father        dx in his 3170s  . Alcohol abuse Father   . CAD Neg Hx   . Diabetes Neg Hx   . Prostate cancer Neg Hx     BP (!) 167/78   Pulse 96   Ht 5\' 6"  (1.676 m)   Wt 170 lb (77.1 kg)   BMI 27.44 kg/m   Review of Systems: See HPI above.    Objective:  Physical Exam:  Gen: NAD, comfortable in exam room.  Neck: No gross deformity, swelling, bruising. TTP left cervical paraspinal region, trapezius.  No midline/bony TTP. FROM with pain on left lateral rotation, extension, flexion. BUE strength  5/5.   Sensation intact to light touch.   2+ equal reflexes in triceps, biceps, brachioradialis tendons. Negative spurlings. NV intact distal BUEs.  Left shoulder: No swelling, ecchymoses.  No gross deformity. No TTP. FROM. Strength 5/5 with empty can and resisted internal/external rotation. NV intact distally.    Assessment & Plan:  1. Left neck, shoulder pain - 2/2 left C7 radiculopathy, similar to previous issue and same distribution.  Exam otherwise reassuring.  Continue prednisone, gabapentin.  Tizanidine as needed.  Will set up for ESI, call us a week after this.  Ergonomic issues discussed.

## 2018-01-02 NOTE — Assessment & Plan Note (Signed)
2/2 left C7 radiculopathy, similar to previous issue and same distribution.  Exam otherwise reassuring.  Continue prednisone, gabapentin.  Tizanidine as needed.  Will set up for ESI, call us a week after this.  Ergonomic issues discussed.

## 2018-01-03 ENCOUNTER — Telehealth: Payer: Self-pay | Admitting: Family Medicine

## 2018-01-03 NOTE — Telephone Encounter (Signed)
Ok thanks 

## 2018-01-03 NOTE — Telephone Encounter (Signed)
Spoke to patient and gave him the information provided and he is going to hold off getting medication at this time. ESI has been moved to Monday March 4th. Told him to contact us in a week to let us know how he is doing.

## 2018-01-03 NOTE — Telephone Encounter (Signed)
Patient calling to inquire about pain medication. Patient states he is in a lot of pain.  He will be finished with the prednisone tomorrow morning and [facility] can not get him in until [facility] th[facility]  He does not want a narcotic like Oxycodone but needs something to ease the pain

## 2018-01-03 NOTE — Telephone Encounter (Signed)
We discussed options and that is likely the only class he isn't taking right now.  He's taking gabapentin, tizanidine (muscle relaxant), the prednisone.  I could prescribe diclofenac to start the day after he finishes prednisone.  We could also prescribe hydrocodone or tramadol for pain but these are narcotics.  We discussed topical medications like capsaicin to apply up to 4 times a day to the area as well.

## 2018-01-06 ENCOUNTER — Ambulatory Visit
Admission: RE | Admit: 2018-01-06 | Discharge: 2018-01-06 | Disposition: A | Discharge status: Home / Self Care | Payer: No Typology Code available for payment source | Source: Ambulatory Visit | Attending: Family Medicine | Admitting: Family Medicine

## 2018-01-06 DIAGNOSIS — M5412 Radiculopathy, cervical region: Secondary | ICD-10-CM

## 2018-01-06 MED ORDER — IOPAMIDOL (ISOVUE-M 300) INJECTION 61%
1.0000 mL | Freq: Once | INTRAMUSCULAR | Status: AC | PRN
  Administered 2018-01-06: 1 mL via EPIDURAL

## 2018-01-06 MED ORDER — TRIAMCINOLONE ACETONIDE 40 MG/ML IJ SUSP (RADIOLOGY)
60.0000 mg | Freq: Once | INTRAMUSCULAR | Status: AC
  Administered 2018-01-06: 60 mg via EPIDURAL

## 2018-01-06 NOTE — Discharge Instructions (Signed)

## 2018-01-09 ENCOUNTER — Other Ambulatory Visit: Payer: No Typology Code available for payment source

## 2018-01-13 ENCOUNTER — Encounter: Payer: Self-pay | Admitting: Family Medicine

## 2018-01-13 ENCOUNTER — Telehealth: Payer: Self-pay | Admitting: Internal Medicine

## 2018-01-13 ENCOUNTER — Telehealth: Payer: Self-pay | Admitting: Family Medicine

## 2018-01-13 ENCOUNTER — Ambulatory Visit (INDEPENDENT_AMBULATORY_CARE_PROVIDER_SITE_OTHER): Payer: No Typology Code available for payment source | Admitting: Family Medicine

## 2018-01-13 DIAGNOSIS — M5010 Cervical disc disorder with radiculopathy, unspecified cervical region: Secondary | ICD-10-CM

## 2018-01-13 NOTE — Telephone Encounter (Signed)
Spoke to patient and scheduled him to come in today.

## 2018-01-13 NOTE — Telephone Encounter (Signed)
Spoke to patient and told him that we would set up second injection. Patient states that he is now feeling weakness in the left arm.

## 2018-01-13 NOTE — Telephone Encounter (Signed)
Patient was seen in office

## 2018-01-13 NOTE — Telephone Encounter (Signed)
LOV: 09/04/17  Dr. Garen LahPaz  [facility] [address]

## 2018-01-13 NOTE — Telephone Encounter (Signed)
Copied from CRM [id number]. Topic: Quick Communication - Rx Refill/Question >> Jan 13, 2018  2:11 PM Stovall, [place] A wrote: Medication: amphetamine-dextroamphetamine (ADDERALL XR) 30 MG 24 hr capsule [[id number]][[id number]]   Has the patient contacted their pharmacy? No    (Agent: If no, request that the patient contact the pharmacy for the refill.)   Preferred Pharmacy (with phone number or street name): [facility] [address]    Agent: Please be advised that RX refills may take up to 3 business days. We ask that you follow-up with your pharmacy.

## 2018-01-13 NOTE — Telephone Encounter (Signed)
If he's having enough weakness that I can discern on exam I'm concerned we may have to be more aggressive.  Can he come in to be reexamined?

## 2018-01-13 NOTE — Telephone Encounter (Signed)
Patient had ESI last Monday.  States he has had some relief but is still in pain. He says on a pain scale of 1 to 10, going into the shot he was a 9 1/2 and now he is at about a 4-5 pain level.  Patient wants to know time frame of getting another injection and about an MRI

## 2018-01-13 NOTE — Telephone Encounter (Signed)
Ok to set up second injection - would be a week from today (each shot up to 3 total shots, spaced 2 weeks apart).  If the injection helped I don't think we need to repeat the MRI at this time though if he doesn't improve with injections, possibly physical therapy, we will need to repeat it if he needs to see a surgeon.

## 2018-01-14 ENCOUNTER — Other Ambulatory Visit: Payer: Self-pay | Admitting: Family Medicine

## 2018-01-14 ENCOUNTER — Encounter: Payer: Self-pay | Admitting: Family Medicine

## 2018-01-14 DIAGNOSIS — M5412 Radiculopathy, cervical region: Secondary | ICD-10-CM

## 2018-01-14 MED ORDER — AMPHETAMINE-DEXTROAMPHET ER 30 MG PO CP24: 30.0000 mg | ORAL_CAPSULE | ORAL | 0 refills | Status: DC

## 2018-01-14 NOTE — Assessment & Plan Note (Signed)
secondary to left C7 radiculopathy.  He is reporting some weakness of but on his exam today his strength is equal and he does not currently have a loss of sensation.  Advised we continue with gabapentin and tizanidine as needed.  We will set him up for the second epidural steroid injection.  We did discussion about whether or not to repeat the MRI given that it would not change the location of his epidural injection we will wait on this.  I did advise him if we do pursue surgical intervention that we will need to repeat the MRI however.

## 2018-01-14 NOTE — Telephone Encounter (Signed)
Sent 2 RXs 

## 2018-01-14 NOTE — Telephone Encounter (Signed)
Requesting:Adderall Contract:08/21/17 UDS:09/04/17 low risk Last Visit:09/04/17 Next Visit:04/07/18 Last Refill:12/17/17  Please Advise

## 2018-01-14 NOTE — Progress Notes (Signed)
PCP: Wanda PlumpPaz, Jose E, MD  Subjective:   HPI: Patient is a 47 y.o. male here for neck, left shoulder pain.  9/15: Patient denies known injury. States for about a month he's had intermittent problems posterior left shoulder - more constant and severe the past week. Pain level 8/10, sharp. Radiates into left arm and arm goes numb. Worse at night and first thing in the morning. No prior issues. No skin changes. No bowel/bladder dysfunction.  08/08/16: Patient reports he had some improvement with prednisone and physical therapy, home exercises but pain recurred. Pain level is 8/10 at base of neck, sharp. Still radiating to left shoulder and depending on neck position left arm will go numb. Does not seem to improve with anything regular. No skin changes. No bowel/bladder dysfunction.  01/01/18: Patient reports over the past couple months he started to get increased problems with pain on left side of his neck. Locked up back in December with more stress at work. He also felt a twinge here when helping with moving a sofa. Has been to PT weekly. Pain level was 10/10 and sharp with radiation into left arm with numbness, same distribution as previously. His pain has improved to 7/10 with prednisone and gabapentin. Hasn't been able to exercise due to pain. No bowel/bladder dysfunction.  3/11: Patient returns today reporting weakness in left upper extremity. He states that his pain is much better following the epidural injection about 1 week ago. His pain is currently 2 out of 10 but up to a 5 out of 10 and sharp at worst. He tried lifting weights on Saturday and noticed that doing things like bench press and overhead presses were weaker on the left. His triceps is a little more sore than he would expect. He has some numbness and tingling into his thumb index and middle fingers. No bowel or bladder dysfunction.  Past Medical History:  Diagnosis Date  . ADD (attention deficit disorder)   .  Herniated disc, cervical    x2    Current Outpatient Medications on File Prior to Visit  Medication Sig Dispense Refill  . amphetamine-dextroamphetamine (ADDERALL XR) 30 MG 24 hr capsule Take 1 capsule (30 mg total) by mouth every morning. 30 capsule 0  . atorvastatin (LIPITOR) 20 MG tablet Take 1 tablet (20 mg total) at bedtime by mouth. 30 tablet 3  . gabapentin (NEURONTIN) 300 MG capsule Take 1 capsule (300 mg total) by mouth at bedtime. 30 capsule 2  . nitroGLYCERIN (NITRODUR - DOSED IN MG/24 HR) 0.2 mg/hr patch Use 1/4 patch daily to the affected area 30 patch 1  . tiZANidine (ZANAFLEX) 4 MG tablet Take 1 tablet (4 mg total) by mouth every 6 (six) hours as needed for muscle spasms. 60 tablet 1   No current facility-administered medications on file prior to visit.     Past Surgical History:  Procedure Laterality Date  . KNEE ARTHROSCOPY     R at age 47    No Known Allergies  Social History   Socioeconomic History  . Marital status: Married    Spouse name: Not on file  . Number of children: 2  . Years of education: Not on file  . Highest education level: Not on file  Social Needs  . Financial resource strain: Not on file  . Food insecurity - worry: Not on file  . Food insecurity - inability: Not on file  . Transportation needs - medical: Not on file  . Transportation needs - non-medical: Not  on file  Occupational History  . Occupation: Ecologist: [employer]  Tobacco Use  . Smoking status: Never Smoker  . Smokeless tobacco: Never Used  Substance and Sexual Activity  . Alcohol use: Yes    Alcohol/week: 0.0 oz    Comment: socially   . Drug use: No  . Sexual activity: Not on file  Other Topics Concern  . Not on file  Social History Narrative   re-married        Family History  Problem Relation Age of Onset  . Colon cancer Father        dx in his 55s  . Alcohol abuse Father   . CAD Neg Hx   . Diabetes Neg Hx   . Prostate cancer Neg Hx     BP (!)  150/95   Pulse 75   Ht 5\' 6"  (1.676 m)   Wt 170 lb (77.1 kg)   BMI 27.44 kg/m   Review of Systems: See HPI above.    Objective:  Physical Exam:  Gen: NAD, comfortable in exam room.  Neck: No gross deformity, swelling, bruising. Mild tenderness to palpation left cervical paraspinal region and trapezius.  No midline or bony tenderness to palpation. Full range of motion with pain on left lateral rotation, extension, and flexion. Bilateral upper extremity strength is 5 out of 5 grossly. Sensation intact to light touch. 2+ equal reflexes in triceps, biceps, brachioradialis tendons. Neurovascularly intact distally.    Assessment & Plan:  1. Left neck, shoulder pain -secondary to left C7 radiculopathy.  He is reporting some weakness of but on his exam today his strength is equal and he does not currently have a loss of sensation.  Advised we continue with gabapentin and tizanidine as needed.  We will set him up for the second epidural steroid injection.  We did discussion about whether or not to repeat the MRI given that it would not change the location of his epidural injection we will wait on this.  I did advise him if we do pursue surgical intervention that we will need to repeat the MRI however.

## 2018-01-21 ENCOUNTER — Ambulatory Visit
Admission: RE | Admit: 2018-01-21 | Discharge: 2018-01-21 | Disposition: A | Discharge status: Home / Self Care | Payer: No Typology Code available for payment source | Source: Ambulatory Visit | Attending: Family Medicine | Admitting: Family Medicine

## 2018-01-21 DIAGNOSIS — M5412 Radiculopathy, cervical region: Secondary | ICD-10-CM

## 2018-01-21 MED ORDER — IOPAMIDOL (ISOVUE-M 300) INJECTION 61%
1.0000 mL | Freq: Once | INTRAMUSCULAR | Status: AC | PRN
  Administered 2018-01-21: 1 mL via EPIDURAL

## 2018-01-21 MED ORDER — TRIAMCINOLONE ACETONIDE 40 MG/ML IJ SUSP (RADIOLOGY)
60.0000 mg | Freq: Once | INTRAMUSCULAR | Status: AC
  Administered 2018-01-21: 60 mg via EPIDURAL

## 2018-01-27 ENCOUNTER — Telehealth: Payer: Self-pay | Admitting: Family Medicine

## 2018-01-27 MED ORDER — PREDNISONE 10 MG PO TABS: ORAL_TABLET | ORAL | 0 refills | Status: DC

## 2018-01-27 NOTE — Telephone Encounter (Signed)
Patient left a message Friday afternoon requesting a refill of prednisone to help the inflammation in his neck.

## 2018-01-27 NOTE — Telephone Encounter (Signed)
Yes, would be just over 1 week from today.  Ok to order.  Thanks!

## 2018-01-27 NOTE — Telephone Encounter (Signed)
Patient was informed.  Patient also asked if he can go ahead with another epidural injection next week, which will be 2 weeks from his last. He is going out of town for work and is worried about sitting in meetings for extended periods of time

## 2018-01-27 NOTE — Telephone Encounter (Signed)
Ok - sent in [facility]one dose [facility] to [facility] [address].

## 2018-01-28 ENCOUNTER — Other Ambulatory Visit: Payer: Self-pay | Admitting: Family Medicine

## 2018-01-28 DIAGNOSIS — M5412 Radiculopathy, cervical region: Secondary | ICD-10-CM

## 2018-01-29 NOTE — Telephone Encounter (Signed)
Order has been sent

## 2018-01-31 ENCOUNTER — Telehealth: Payer: Self-pay | Admitting: Family Medicine

## 2018-01-31 NOTE — Telephone Encounter (Signed)
Ok to go ahead with the referral for him.  I'd encourage him to still get the shot in addition to this.

## 2018-01-31 NOTE — Telephone Encounter (Signed)
Spoke to patient and he is going to keep his appointment for the injection. Told him that we would send in referral and that they would contact him for appointment.

## 2018-01-31 NOTE — Telephone Encounter (Signed)
Patient has epidural injection scheduled for next Wednesday but is skeptical that the injection will resolve his pain. He is requesting to be referred to Dr. Marikay Alaravid Jones at [facility] for a consultation

## 2018-02-05 ENCOUNTER — Ambulatory Visit
Admission: RE | Admit: 2018-02-05 | Discharge: 2018-02-05 | Disposition: A | Discharge status: Home / Self Care | Payer: No Typology Code available for payment source | Source: Ambulatory Visit | Attending: Family Medicine | Admitting: Family Medicine

## 2018-02-05 DIAGNOSIS — M5412 Radiculopathy, cervical region: Secondary | ICD-10-CM

## 2018-02-05 MED ORDER — IOPAMIDOL (ISOVUE-M 300) INJECTION 61%
1.0000 mL | Freq: Once | INTRAMUSCULAR | Status: AC | PRN
  Administered 2018-02-05: 1 mL via EPIDURAL

## 2018-02-05 MED ORDER — TRIAMCINOLONE ACETONIDE 40 MG/ML IJ SUSP (RADIOLOGY)
60.0000 mg | Freq: Once | INTRAMUSCULAR | Status: AC
  Administered 2018-02-05: 60 mg via EPIDURAL

## 2018-02-08 IMAGING — XA DG INJECT/[PERSON_NAME] INC NEEDLE/CATH/PLC EPI/CERV/THOR W/IMG
2 series · 2 of 2 positions shown · non-contrast
Comparison: none

CLINICAL DATA: Cervical disc disorder with radiculopathy.

[Series 1: ortho standard · 1 of 1 slices shown (1 of 2)]
[im 1/1]
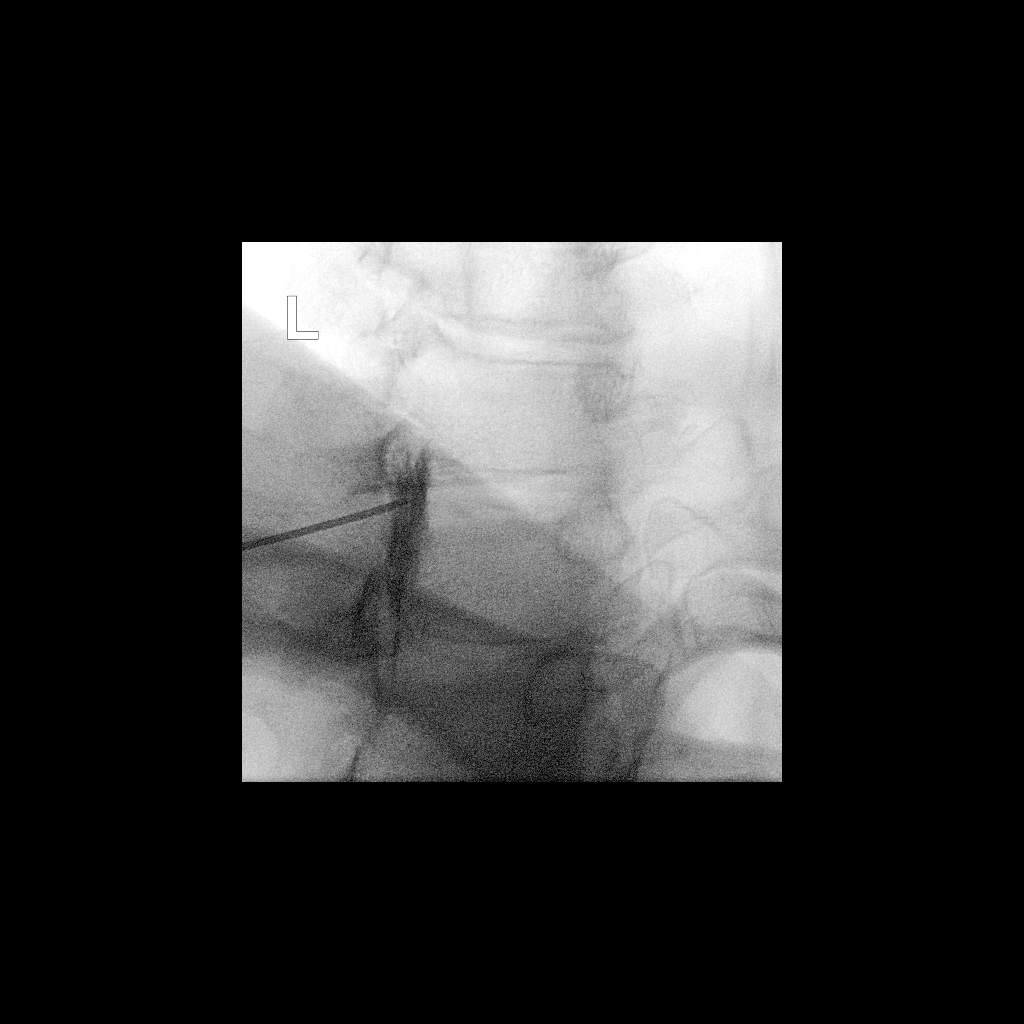

[Series 2: ortho standard · 1 of 1 slices shown (2 of 2)]
[im 1/1]
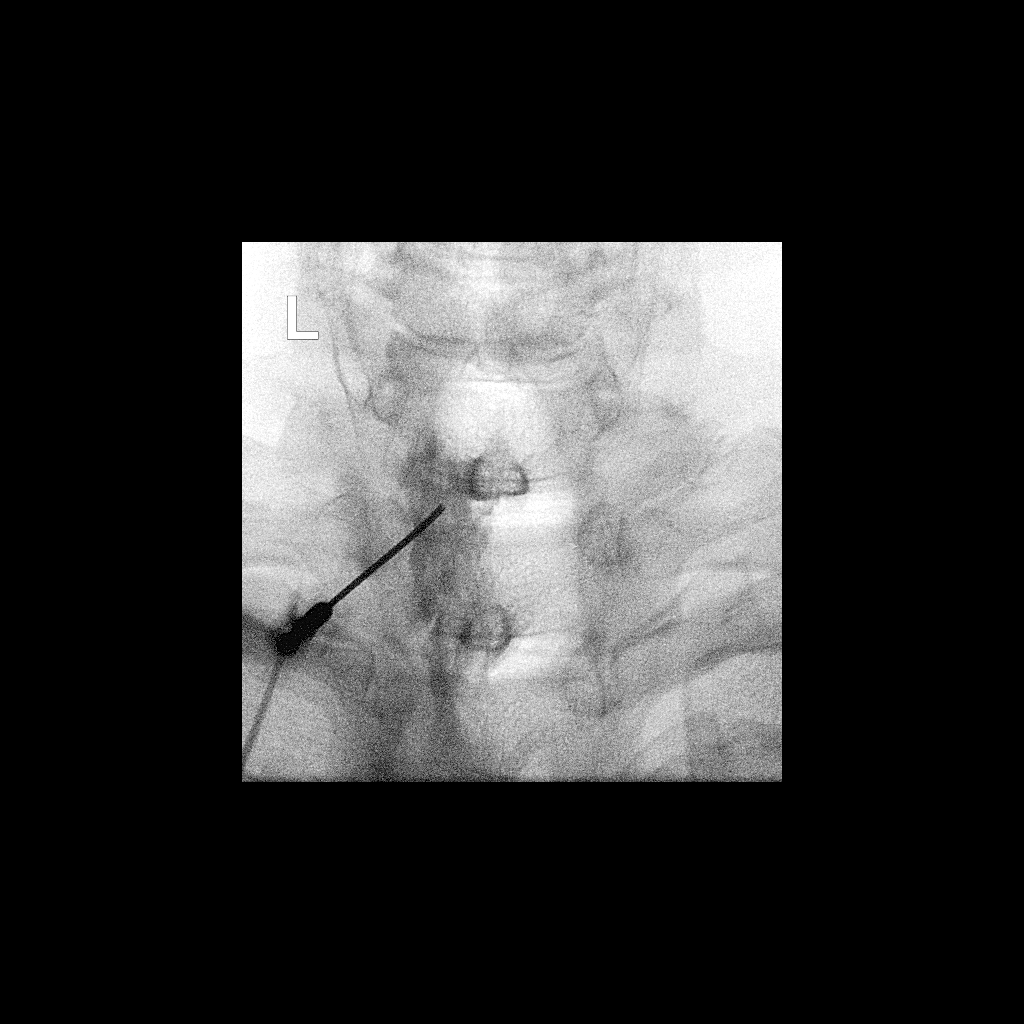

[2 of 2 positions shown; findings below may reference images not displayed]

FLUOROSCOPY TIME:  Radiation Exposure Index (as provided by the
fluoroscopic device): 9.27 uGy*m2

Fluoroscopy Time:  22 seconds

Number of Acquired Images:  0

PROCEDURE:
CERVICAL EPIDURAL INJECTION

An interlaminar approach was performed on the left at C7-T1 . A 20
gauge epidural needle was advanced using loss-of-resistance
technique.

DIAGNOSTIC EPIDURAL INJECTION

Injection of Isovue-M 300 shows a good epidural pattern with spread
above and below the level of needle placement, primarily on the
left. No vascular opacification is seen. THERAPEUTIC

EPIDURAL INJECTION

1.5 ml of Kenalog 40 mixed with 1 ml of 1% Lidocaine and 2 ml of
normal saline were then instilled. The procedure was well-tolerated,
and the patient was discharged thirty minutes following the
injection in good condition.
IMPRESSION: Technically successful first epidural injection on the left at
C7-T1.

## 2018-02-14 ENCOUNTER — Other Ambulatory Visit: Payer: Self-pay | Admitting: Internal Medicine

## 2018-02-14 MED ORDER — AMPHETAMINE-DEXTROAMPHET ER 30 MG PO CP24: 30.0000 mg | ORAL_CAPSULE | ORAL | 0 refills | Status: DC

## 2018-02-14 NOTE — Telephone Encounter (Signed)
Copied from CRM [id number]. Topic: General - Other >> Feb 14, 2018  1:56 PM Cecelia ByarsGreen, Temeka L, RMA wrote: Reason for CRM: Medication refill request for amphetamine-dextroamphetamine (ADDERALL XR) 30 MG 24 hr capsule to be sent to [facility] [address]

## 2018-02-14 NOTE — Telephone Encounter (Signed)
Refilled on behalf of provider.  

## 2018-02-14 NOTE — Telephone Encounter (Signed)
Pt is requesting refill on Adderall XR 30mg .   Last OV: 09/04/2017 Last Fill: 01/14/2018 #30 and 0RF UDS: 09/04/2017 Low risk  NCCR printed- no discrepancies noted- sent for scanning  Please advise.

## 2018-02-14 NOTE — Telephone Encounter (Signed)
Rx refill request: Adderall 30 mg  LOV: 09/04/17   PCP: Drue NovelPaz   Pharmacy: [facility]

## 2018-04-07 ENCOUNTER — Ambulatory Visit: Payer: BLUE CROSS/BLUE SHIELD | Admitting: Internal Medicine

## 2018-04-29 ENCOUNTER — Ambulatory Visit (INDEPENDENT_AMBULATORY_CARE_PROVIDER_SITE_OTHER): Payer: No Typology Code available for payment source | Admitting: Internal Medicine

## 2018-04-29 ENCOUNTER — Encounter: Payer: Self-pay | Admitting: Internal Medicine

## 2018-04-29 VITALS — BP 118/60 | HR 81 | Temp 98.1°F | Resp 16 | Ht 66.0 in | Wt 172.5 lb

## 2018-04-29 DIAGNOSIS — F988 Other specified behavioral and emotional disorders with onset usually occurring in childhood and adolescence: Secondary | ICD-10-CM

## 2018-04-29 DIAGNOSIS — E785 Hyperlipidemia, unspecified: Secondary | ICD-10-CM

## 2018-04-29 MED ORDER — AMPHETAMINE-DEXTROAMPHET ER 30 MG PO CP24: 30.0000 mg | ORAL_CAPSULE | ORAL | 0 refills | Status: DC

## 2018-04-29 MED FILL — ADDERALL XR 30 MG CAP SA: 30 | 30 days supply | Qty: 30 | Fill #0

## 2018-04-29 NOTE — Progress Notes (Signed)
Pre visit review using our clinic review tool, if applicable. No additional management support is needed unless otherwise documented below in the visit note. 

## 2018-04-29 NOTE — Patient Instructions (Addendum)
   GO TO THE FRONT DESK Schedule  Labs to be done around 1 month from today  Schedule your next appointment for a  Physical 09/2018

## 2018-04-29 NOTE — Progress Notes (Signed)
Subjective:    Patient ID: Kyle Dunn, male    DOB: [birth year], 47 y.o.   MRN: [id number]  DOS:  04/29/2018 Type of visit - description : rov  Interval history: Since last office visit, had a lot of problems with neck pain and a radiculopathy.  Was on steroids, multiple OTCs, local injections, eventually went to see a neurosurgeon, was recommended surgery but he declined. He underwent treatment called PROLO and since then he is doing great. ADD: Needs a refill.  Symptoms controlled High cholesterol: So far declined to take Lipitor.   Review of Systems Is doing well with his diet, is extremely active, does biking daily. No chest pain or difficulty breathing  Past Medical History:  Diagnosis Date  . ADD (attention deficit disorder)   . Herniated disc, cervical    x2    Past Surgical History:  Procedure Laterality Date  . KNEE ARTHROSCOPY     R at age 47    Social History   Socioeconomic History  . Marital status: Married    Spouse name: Not on file  . Number of children: 2  . Years of education: Not on file  . Highest education level: Not on file  Occupational History  . Occupation: Ecologistfurniture     Employer: OTHER  Social Needs  . Financial resource strain: Not on file  . Food insecurity:    Worry: Not on file    Inability: Not on file  . Transportation needs:    Medical: Not on file    Non-medical: Not on file  Tobacco Use  . Smoking status: Never Smoker  . Smokeless tobacco: Never Used  Substance and Sexual Activity  . Alcohol use: Yes    Alcohol/week: 0.0 oz    Comment: socially   . Drug use: No  . Sexual activity: Not on file  Lifestyle  . Physical activity:    Days per week: Not on file    Minutes per session: Not on file  . Stress: Not on file  Relationships  . Social connections:    Talks on phone: Not on file    Gets together: Not on file    Attends religious service: Not on file    Active member of club or organization: Not on  file    Attends meetings of clubs or organizations: Not on file    Relationship status: Not on file  . Intimate partner violence:    Fear of current or ex partner: Not on file    Emotionally abused: Not on file    Physically abused: Not on file    Forced sexual activity: Not on file  Other Topics Concern  . Not on file  Social History Narrative   re-married          Allergies as of 04/29/2018   No Known Allergies     Medication List        Accurate as of 04/29/18 11:59 PM. Always use your most recent med list.          amphetamine-dextroamphetamine 30 MG 24 hr capsule Commonly known as:  ADDERALL XR Take 1 capsule (30 mg total) by mouth every morning.   RED YEAST RICE PO Take by mouth.          Objective:   Physical Exam BP 118/60 (BP Location: Left Arm, Patient Position: Sitting, Cuff Size: Small)   Pulse 81   Temp 98.1 F (36.7 C) (Oral)   Resp 16  Ht 5\' 6"  (1.676 m)   Wt 172 lb 8 oz (78.2 kg)   SpO2 98%   BMI 27.84 kg/m  General:   Well developed, NAD, see BMI.  HEENT:  Normocephalic . Face symmetric, atraumatic Lungs:  CTA B Normal respiratory effort, no intercostal retractions, no accessory muscle use. Heart: RRR,  no murmur.  No pretibial edema bilaterally  Skin: Not pale. Not jaundice Neurologic:  alert & oriented X3.  Speech normal, gait appropriate for age and unassisted Psych--  Cognition and judgment appear intact.  Cooperative with normal attention span and concentration.  Behavior appropriate. No anxious or depressed appearing.      Assessment & Plan:  Assessment ADD Hyperlipidemia  PLAN ADD: Refill meds today and as needed Hyperlipidemia: Last cholesterol 278, LDL 207.  I recommended Lipitor, patient declined, he does really well with diet, extremely active, no known family history, no chest pain or difficulty breathing.  Patient is somewhat reluctant to take medications.  We agreed to check his cholesterol in 1 month and  reassess the situation. I did run his numbers through the [facility]  CV RF calculator, then asked the cholesterol clinic about the best treatment for [facility]. He  Definitely will  benefit from statins regardles of the % of risk  because his LDL is more than 190.  If he is not able or agreeable to try a statin, they recommend to consider a coronary calcium score. We will keep that in consideration. Radiculopathy: See HPI, the last visit, had local injections x3 with no help, several rounds of prednisone, was recommended surgery.  Eventually symptoms got better after an alternative treatment called "Prolo"  . CPX 09-2018   Today, I spent more than   30 min with the patient: >50% of the time counseling regards his cholesterol levels, treatment options and trying to assert the pros and cons of statins.

## 2018-04-30 NOTE — Assessment & Plan Note (Signed)
ADD: Refill meds today and as needed Hyperlipidemia: Last cholesterol 278, LDL 207.  I recommended Lipitor, patient declined, he does really well with diet, extremely active, no known family history, no chest pain or difficulty breathing.  Patient is somewhat reluctant to take medications.  We agreed to check his cholesterol in 1 month and reassess the situation. I did run his numbers through the [facility]  CV RF calculator, then asked the cholesterol clinic about the best treatment for Kyle Dunn. He  Definitely will  benefit from statins regardles of the % of risk  because his LDL is more than 190.  If he is not able or agreeable to try a statin, they recommend to consider a coronary calcium score. We will keep that in consideration. Radiculopathy: See HPI, the last visit, had local injections x3 with no help, several rounds of prednisone, was recommended surgery.  Eventually symptoms got better after an alternative treatment called "Prolo"  . CPX 09-2018

## 2018-05-29 ENCOUNTER — Other Ambulatory Visit: Payer: No Typology Code available for payment source

## 2018-06-03 ENCOUNTER — Telehealth: Payer: Self-pay | Admitting: Internal Medicine

## 2018-06-03 MED ORDER — AMPHETAMINE-DEXTROAMPHET ER 30 MG PO CP24: 30.0000 mg | ORAL_CAPSULE | ORAL | 0 refills | Status: DC

## 2018-06-03 NOTE — Telephone Encounter (Signed)
Copied from CRM [id number]. Topic: Quick Communication - Rx Refill/Question >> Jun 03, 2018  9:10 AM Arlyss Gandyichardson, Bobie Caris N, NT wrote: Medication: amphetamine-dextroamphetamine (ADDERALL XR) 30 MG 24 hr capsule   Has the patient contacted their pharmacy? Yes.   (Agent: If no, request that the patient contact the pharmacy for the refill.) (Agent: If yes, when and what did the pharmacy advise?)  Preferred Pharmacy (with phone number or street name): [id number] - [place], [place] - [address] [facility] [address] [phone] (Phone) [phone] (Fax)      Agent: Please be advised that RX refills may take up to 3 business days. We ask that you follow-up with your pharmacy.

## 2018-06-03 NOTE — Telephone Encounter (Signed)
Pt is requesting refill on Adderall XR 30mg .   Last OV: 04/29/2018 Last Fill: 04/29/2018 #30 and [id number] UDS: 09/04/2017 Low risk  NCCR printed- no discrepancies noted- sent for scanning   Please advise.

## 2018-06-03 NOTE — Telephone Encounter (Signed)
sent 

## 2018-07-08 ENCOUNTER — Other Ambulatory Visit: Payer: Self-pay | Admitting: Internal Medicine

## 2018-07-08 NOTE — Telephone Encounter (Signed)
Refill of adderall  LOV 04/29/18 Dr. Drue Novel  [facility] 06/03/18  #30  0 refills   [id number] - [place], [place] - [address] AT [facility] [address] [phone] (Phone) [phone] (Fax)

## 2018-07-08 NOTE — Telephone Encounter (Signed)
Copied from CRM [id number]. Topic: Quick Communication - Rx Refill/Question >> Jul 08, 2018  9:34 AM Gloriann Loan L wrote: Medication: amphetamine-dextroamphetamine (ADDERALL XR) 30 MG 24 hr capsule  Has the patient contacted their pharmacy? Yes.   (Agent: If no, request that the patient contact the pharmacy for the refill.) (Agent: If yes, when and what did the pharmacy advise?) today  they told him they didn't have it on file to call provider  Preferred Pharmacy (with phone number or street name): [id number] - [place], [place] - [address] AT [facility] [address] [phone] (Phone) [phone] (Fax)    Agent: Please be advised that RX refills may take up to 3 business days. We ask that you follow-up with your pharmacy.

## 2018-07-09 NOTE — Telephone Encounter (Signed)
3 RXs sent recently, please check w/ pharmacy for RFs

## 2018-07-09 NOTE — Telephone Encounter (Signed)
Noted. Pharmacy confirmed receipt of Rx;s

## 2018-08-19 ENCOUNTER — Telehealth: Payer: Self-pay | Admitting: Internal Medicine

## 2018-08-19 NOTE — Telephone Encounter (Signed)
3 refills sent to his pharmacy on 06/03/2018 for August, September, October- should already be on file- he just needs to call pharmacy.

## 2018-08-19 NOTE — Telephone Encounter (Signed)
Copied from CRM [id number]. Topic: Quick Communication - Rx Refill/Question >> Aug 19, 2018  9:07 AM Luanna Cole wrote: Medication: amphetamine-dextroamphetamine (ADDERALL XR) 30 MG 24 hr capsule   Has the patient contacted their pharmacy? Yes.   (Agent: If no, request that the patient contact the pharmacy for the refill.) (Agent: If yes, when and what did the pharmacy advise?)  Preferred Pharmacy (with phone number or street name): [id number] - [place], [place] - [address] AT [facility] [address] [phone] (Phone) [phone] (Fax)

## 2018-09-23 ENCOUNTER — Telehealth: Payer: Self-pay | Admitting: Internal Medicine

## 2018-09-23 NOTE — Telephone Encounter (Signed)
Copied from CRM [id number]. Topic: Quick Communication - Rx Refill/Question >> Sep 23, 2018  9:40 AM Burchel, Abbi R wrote: Medication: amphetamine-dextroamphetamine (ADDERALL XR) 30 MG 24 hr capsule  Has the patient contacted their pharmacy? No. (Agent: If no, request that the patient contact the pharmacy for the refill.) (Agent: If yes, when and what did the pharmacy advise?)  [facility] Pharm[facility]: [id number] - [place], [place] - [address] AT [facility] [address] [phone] (Phone) [phone] (Fax)    Pt was advised that RX refills may take up to 3 business days. We ask that you follow-up with your pharmacy.

## 2018-09-24 MED ORDER — AMPHETAMINE-DEXTROAMPHET ER 30 MG PO CP24: 30.0000 mg | ORAL_CAPSULE | ORAL | 0 refills | Status: DC

## 2018-09-24 NOTE — Telephone Encounter (Signed)
Sent!

## 2018-09-24 NOTE — Telephone Encounter (Signed)
Pt is requesting refill on Adderall.   Last OV: 04/29/2018 Last Fill: 06/03/2018 #30 and 0RF (x3 prescriptions) UDS: 09/04/2017 Low risk  DUE FOR UDS AND CONTRACT AT NEXT OV  NCCR printed- no discrepancies noted- sent for scanning

## 2018-09-24 NOTE — Telephone Encounter (Signed)
Requested medication (s) are due for refill today: yes  Requested medication (s) are on the active medication list: yes    Last refill: 06/03/18  #30  0 refills  Future visit scheduled yes  10/01/18  Dr. Drue NovelPaz  Notes to clinic:not delegated  Requested Prescriptions  Pending Prescriptions Disp Refills   amphetamine-dextroamphetamine (ADDERALL XR) 30 MG 24 hr capsule 30 capsule 0    Sig: Take 1 capsule (30 mg total) by mouth every morning.     Not Delegated - Psychiatry:  Stimulants/ADHD Failed - 09/24/2018  7:50 AM      Failed - This refill cannot be delegated      Failed - Urine Drug Screen completed in last 360 days.      Failed - Valid encounter within last 3 months    Recent Outpatient Visits          4 months ago Attention deficit disorder, unspecified hyperactivity presence   [facility] [place], [place]   1 year ago Annual physical exam   [facility] [place], [place], [place]   1 year ago Attention deficit disorder, unspecified hyperactivity presence   [facility] [place], [place], [place]   2 years ago Attention deficit disorder, unspecified hyperactivity presence   [facility] [place], [place] E, MD   2 years ago Annual physical exam   [facility] [place] E, [place]      Future Appointments            In 1 week Wanda PlumpPaz, Jose E, MD [facility] [place], [place]

## 2018-10-01 ENCOUNTER — Ambulatory Visit: Payer: No Typology Code available for payment source | Admitting: Internal Medicine

## 2018-10-08 ENCOUNTER — Ambulatory Visit: Payer: No Typology Code available for payment source | Admitting: Internal Medicine

## 2018-10-08 ENCOUNTER — Encounter: Payer: Self-pay | Admitting: Internal Medicine

## 2018-10-08 VITALS — BP 108/68 | HR 73 | Temp 97.7°F | Resp 16 | Ht 66.0 in | Wt 179.4 lb

## 2018-10-08 DIAGNOSIS — Z0000 Encounter for general adult medical examination without abnormal findings: Secondary | ICD-10-CM | POA: Diagnosis not present

## 2018-10-08 DIAGNOSIS — Z79899 Other long term (current) drug therapy: Secondary | ICD-10-CM | POA: Diagnosis not present

## 2018-10-08 DIAGNOSIS — F988 Other specified behavioral and emotional disorders with onset usually occurring in childhood and adolescence: Secondary | ICD-10-CM | POA: Diagnosis not present

## 2018-10-08 LAB — CBC WITH DIFFERENTIAL/PLATELET
BASOS ABS: 0 10*3/uL (ref 0.0–0.1)
Basophils Relative: 0.8 % (ref 0.0–3.0)
Eosinophils Absolute: 0.1 10*3/uL (ref 0.0–0.7)
Eosinophils Relative: 2.9 % (ref 0.0–5.0)
HEMATOCRIT: 48.1 % (ref 39.0–52.0)
Hemoglobin: 16.4 g/dL (ref 13.0–17.0)
LYMPHS ABS: 1.3 10*3/uL (ref 0.7–4.0)
Lymphocytes Relative: 27.9 % (ref 12.0–46.0)
MCHC: 34.2 g/dL (ref 30.0–36.0)
MCV: 86.9 fl (ref 78.0–100.0)
MONOS PCT: 15.3 % — AB (ref 3.0–12.0)
Monocytes Absolute: 0.7 10*3/uL (ref 0.1–1.0)
NEUTROS ABS: 2.5 10*3/uL (ref 1.4–7.7)
NEUTROS PCT: 53.1 % (ref 43.0–77.0)
Platelets: 230 10*3/uL (ref 150.0–400.0)
RBC: 5.53 Mil/uL (ref 4.22–5.81)
RDW: 13.1 % (ref 11.5–15.5)
WBC: 4.7 10*3/uL (ref 4.0–10.5)

## 2018-10-08 LAB — COMPREHENSIVE METABOLIC PANEL
ALBUMIN: 4.8 g/dL (ref 3.5–5.2)
ALK PHOS: 48 U/L (ref 39–117)
ALT: 44 U/L (ref 0–53)
AST: 33 U/L (ref 0–37)
BUN: 21 mg/dL (ref 6–23)
CALCIUM: 9.7 mg/dL (ref 8.4–10.5)
CHLORIDE: 100 meq/L (ref 96–112)
CO2: 31 mEq/L (ref 19–32)
Creatinine, Ser: 0.86 mg/dL (ref 0.40–1.50)
GFR: 100.66 mL/min (ref >60.00)
Glucose, Bld: 98 mg/dL (ref 70–99)
POTASSIUM: 5 meq/L (ref 3.5–5.1)
SODIUM: 138 meq/L (ref 135–145)
TOTAL PROTEIN: 7.1 g/dL (ref 6.0–8.3)
Total Bilirubin: 0.6 mg/dL (ref 0.2–1.2)

## 2018-10-08 LAB — LIPID PANEL
CHOLESTEROL: 302 mg/dL — AB (ref 0–200)
HDL: 60.3 mg/dL (ref >39.00)
LDL CALC: 218 mg/dL — AB (ref 0–99)
NonHDL: 241.5
TRIGLYCERIDES: 117 mg/dL (ref 0.0–149.0)
Total CHOL/HDL Ratio: 5
VLDL: 23.4 mg/dL (ref 0.0–40.0)

## 2018-10-08 LAB — TSH: TSH: 1.37 u[IU]/mL (ref 0.35–4.50)

## 2018-10-08 NOTE — Assessment & Plan Note (Signed)
-  Td 2014;  flu shot: Pro-cons discussed, declined  -HQI:ONGEXCCS:Never had a cscope , father had colon ca dx  in his 2670s, no other family members affected, no FH of colon polyps . Will get a colonoscopy at age 47 -prostate ca screening : start at age 47 -He has a very healthy lifestyle. -Labs: CMP, FLP, CBC, TSH, UDS

## 2018-10-08 NOTE — Patient Instructions (Signed)
GO TO THE LAB : Get the blood work     GO TO THE FRONT DESK Schedule your next appointment for a  Check up in 6-8 months  

## 2018-10-08 NOTE — Progress Notes (Signed)
Pre visit review using our clinic review tool, if applicable. No additional management support is needed unless otherwise documented below in the visit note. 

## 2018-10-08 NOTE — Progress Notes (Signed)
Subjective:    Patient ID: Kyle Dunn, male    DOB: June 21, 1971, 47 y.o.   MRN: [id number]  DOS:  10/08/2018 Type of visit - description : cpx Since the last office visit is feeling great, neck pain with radiculopathy almost 100% resolved.   Review of Systems  Other than above, a 14 point review of systems is negative     Past Medical History:  Diagnosis Date  . ADD (attention deficit disorder)   . Herniated disc, cervical    x2  . Pain, neck 2019   w/ L radiculopathy, better w/ conservative RX    Past Surgical History:  Procedure Laterality Date  . KNEE ARTHROSCOPY     R at age 54    Social History   Socioeconomic History  . Marital status: Married    Spouse name: Not on file  . Number of children: 2  . Years of education: Not on file  . Highest education level: Not on file  Occupational History  . Occupation: Ecologist: [employer]  Social Needs  . Financial resource strain: Not on file  . Food insecurity:    Worry: Not on file    Inability: Not on file  . Transportation needs:    Medical: Not on file    Non-medical: Not on file  Tobacco Use  . Smoking status: Never Smoker  . Smokeless tobacco: Never Used  Substance and Sexual Activity  . Alcohol use: Yes    Alcohol/week: 0.0 standard drinks    Comment: socially   . Drug use: No  . Sexual activity: Not on file  Lifestyle  . Physical activity:    Days per week: Not on file    Minutes per session: Not on file  . Stress: Not on file  Relationships  . Social connections:    Talks on phone: Not on file    Gets together: Not on file    Attends religious service: Not on file    Active member of club or organization: Not on file    Attends meetings of clubs or organizations: Not on file    Relationship status: Not on file  . Intimate partner violence:    Fear of current or ex partner: Not on file    Emotionally abused: Not on file    Physically abused: Not on file    Forced  sexual activity: Not on file  Other Topics Concern  . Not on file  Social History Narrative   re-married         Family History  Problem Relation Age of Onset  . Colon cancer Father        dx in his 57s  . Alcohol abuse Father   . CAD Neg Hx   . Diabetes Neg Hx   . Prostate cancer Neg Hx      Allergies as of 10/08/2018   No Known Allergies     Medication List        Accurate as of 10/08/18 11:59 PM. Always use your most recent med list.          amphetamine-dextroamphetamine 30 MG 24 hr capsule Commonly known as:  ADDERALL XR Take 1 capsule (30 mg total) by mouth every morning.   amphetamine-dextroamphetamine 30 MG 24 hr capsule Commonly known as:  ADDERALL XR Take 1 capsule (30 mg total) by mouth every morning.   multivitamin with minerals Tabs tablet Take 1 tablet by mouth daily.  RED YEAST RICE PO Take by mouth.           Objective:   Physical Exam BP 108/68 (BP Location: Left Arm, Patient Position: Sitting, Cuff Size: Normal)   Pulse 73   Temp 97.7 F (36.5 C) (Oral)   Resp 16   Ht 5\' 6"  (1.676 m)   Wt 179 lb 6 oz (81.4 kg)   SpO2 98%   BMI 28.95 kg/m  General: Well developed, NAD, BMI noted Neck: No  thyromegaly  HEENT:  Normocephalic . Face symmetric, atraumatic Lungs:  CTA B Normal respiratory effort, no intercostal retractions, no accessory muscle use. Heart: RRR,  no murmur.  No pretibial edema bilaterally  Abdomen:  Not distended, soft, non-tender. No rebound or rigidity.   Skin: Exposed areas without rash. Not pale. Not jaundice Neurologic:  alert & oriented X3.  Speech normal, gait appropriate for age and unassisted Strength symmetric and appropriate for age.  Psych: Cognition and judgment appear intact.  Cooperative with normal attention span and concentration.  Behavior appropriate. No anxious or depressed appearing.     Assessment & Plan:    Assessment ADD Hyperlipidemia  PLAN ADD: On Adderall, UDS and contract  today Hyperlipidemia: Currently diet controlled, 10-year cardiovascular risk factor is 8.9%, explained the patient that based on that and the fact that his LDL is high he qualify for statins, pros and cons discussed.  Will check labs and will leave it up to him if he likes to start medication.  I also mention a calcium coronary score but he is really not that interested. Neck pain with left radiculopathy: After he try alternative medicine "Prolo" he got much improved.  No pain, left arm is a still slightly weak. RTC 6 to 8 months

## 2018-10-09 NOTE — Assessment & Plan Note (Signed)
ADD: On Adderall, UDS and contract today Hyperlipidemia: Currently diet controlled, 10-year cardiovascular risk factor is 8.9%, explained the patient that based on that and the fact that his LDL is high he qualify for statins, pros and cons discussed.  Will check labs and will leave it up to him if he likes to start medication.  I also mention a calcium coronary score but he is really not that interested. Neck pain with left radiculopathy: After he try alternative medicine "Prolo" he got much improved.  No pain, left arm is a still slightly weak. RTC 6 to 8 months

## 2018-10-10 MED ORDER — ATORVASTATIN CALCIUM 40 MG PO TABS: 40.0000 mg | ORAL_TABLET | Freq: Every day | ORAL | 3 refills | Status: DC

## 2018-10-10 NOTE — Addendum Note (Signed)
Addended byConrad : Deliliah Spranger D on: 10/10/2018 04:47 PM   Modules accepted: Orders

## 2018-10-11 LAB — PAIN MGMT, PROFILE 8 W/CONF, U
6 Acetylmorphine: NEGATIVE ng/mL (ref <10)
ALCOHOL METABOLITES: POSITIVE ng/mL — AB (ref <500)
Amphetamines: NEGATIVE ng/mL (ref <500)
BENZODIAZEPINES: NEGATIVE ng/mL (ref <100)
Buprenorphine, Urine: NEGATIVE ng/mL (ref <5)
Cocaine Metabolite: NEGATIVE ng/mL (ref <150)
Creatinine: 108.2 mg/dL
ETHYL SULFATE (ETS): 9348 ng/mL — AB (ref <100)
Ethyl Glucuronide (ETG): 50404 ng/mL — ABNORMAL HIGH (ref <500)
MDMA: NEGATIVE ng/mL (ref <500)
Marijuana Metabolite: NEGATIVE ng/mL (ref <20)
OXIDANT: NEGATIVE ug/mL (ref <200)
OXYCODONE: NEGATIVE ng/mL (ref <100)
Opiates: NEGATIVE ng/mL (ref <100)
pH: 7.34 (ref 4.5–9.0)

## 2018-12-01 ENCOUNTER — Telehealth: Payer: Self-pay | Admitting: Internal Medicine

## 2018-12-01 MED ORDER — AMPHETAMINE-DEXTROAMPHET ER 30 MG PO CP24: 30.0000 mg | ORAL_CAPSULE | ORAL | 0 refills | Status: DC

## 2018-12-01 NOTE — Telephone Encounter (Signed)
Pt is requesting refill on Adderall.   Last OV: 10/08/2018 Last Fill: 09/24/2018 #30 and 0RF (For November and December 2019) UDS: 10/08/2018 Low risk  NCCR in media 09/24/2018

## 2018-12-01 NOTE — Telephone Encounter (Signed)
sent 

## 2018-12-01 NOTE — Telephone Encounter (Signed)
Copied from CRM [id number]. Topic: Quick Communication - Rx Refill/Question >> Dec 01, 2018  9:20 AM Jens Som A wrote: Medication: amphetamine-dextroamphetamine (ADDERALL XR) 30 MG 24 hr capsule [[id number]]   Has the patient contacted their pharmacy? Yes  (Agent: If no, request that the patient contact the pharmacy for the refill.) (Agent: If yes, when and what did the pharmacy advise?)  Preferred Pharmacy (with phone number or street name): [id number] - [place], [place] - [address] AT [facility] OF [address] [phone] (Phone) [phone] (Fax)    Agent: Please be advised that RX refills may take up to 3 business days. We ask that you follow-up with your pharmacy.

## 2019-01-01 ENCOUNTER — Other Ambulatory Visit: Payer: Self-pay | Admitting: Internal Medicine

## 2019-01-01 NOTE — Telephone Encounter (Signed)
Copied from CRM [id number]. Topic: Quick Communication - Rx Refill/Question >> Jan 01, 2019  4:26 PM Floria Raveling A wrote: Medication: amphetamine-dextroamphetamine (ADDERALL XR) 30 MG 24 hr capsule [[id number]- 2 days left   Has the patient contacted their pharmacy? No. (Agent: If no, request that the patient contact the pharmacy for the refill.) (Agent: If yes, when and what did the pharmacy advise?)  Preferred Pharmacy (with phone number or street name): [id number] - [place],  - [address] AT [facility] OF [address]  Agent: Please be advised that RX refills may take up to 3 business days. We ask that you follow-up with your pharmacy.

## 2019-02-11 ENCOUNTER — Telehealth: Payer: Self-pay | Admitting: Internal Medicine

## 2019-02-12 MED ORDER — AMPHETAMINE-DEXTROAMPHET ER 30 MG PO CP24: 30.0000 mg | ORAL_CAPSULE | ORAL | 0 refills | Status: DC

## 2019-02-12 NOTE — Telephone Encounter (Signed)
Sent!

## 2019-02-12 NOTE — Telephone Encounter (Signed)
Pt is requesting refill on Adderall.   Last OV: 10/08/2018 Last Fill: 12/01/2018 #30 and 0RF (For January and February) UDS: 10/08/2018 Low risk

## 2019-03-18 ENCOUNTER — Other Ambulatory Visit: Payer: Self-pay | Admitting: Internal Medicine

## 2019-03-18 NOTE — Telephone Encounter (Signed)
May prescription already at the pharmacy. Needs to contact [facility] directly.

## 2019-03-18 NOTE — Telephone Encounter (Signed)
Copied from CRM [id number]. Topic: Quick Communication - Rx Refill/Question >> Mar 18, 2019  8:40 AM Wyonia Hough E wrote: Medication: amphetamine-dextroamphetamine (ADDERALL XR) 30 MG 24 hr capsule  Has the patient contacted their pharmacy?   Preferred Pharmacy (with phone number or street name): [id number] - [place], [place] - [address] AT [facility] OF [address] [phone] (Phone) [phone] (Fax)    Agent: Please be advised that RX refills may take up to 3 business days. We ask that you follow-up with your pharmacy.

## 2019-04-13 ENCOUNTER — Ambulatory Visit: Payer: No Typology Code available for payment source | Admitting: Internal Medicine

## 2019-04-16 ENCOUNTER — Encounter: Payer: Self-pay | Admitting: Internal Medicine

## 2019-04-16 ENCOUNTER — Other Ambulatory Visit: Payer: Self-pay

## 2019-04-16 ENCOUNTER — Ambulatory Visit (INDEPENDENT_AMBULATORY_CARE_PROVIDER_SITE_OTHER): Payer: No Typology Code available for payment source | Admitting: Internal Medicine

## 2019-04-16 DIAGNOSIS — E785 Hyperlipidemia, unspecified: Secondary | ICD-10-CM

## 2019-04-16 DIAGNOSIS — F988 Other specified behavioral and emotional disorders with onset usually occurring in childhood and adolescence: Secondary | ICD-10-CM | POA: Diagnosis not present

## 2019-04-16 MED ORDER — ATORVASTATIN CALCIUM 40 MG PO TABS: 40.0000 mg | ORAL_TABLET | Freq: Every day | ORAL | 6 refills | Status: DC

## 2019-04-16 NOTE — Progress Notes (Signed)
Subjective:    Patient ID: Kyle Dunn, male    DOB: 1971/10/01, 48 y.o.   MRN: [id number]  DOS:  04/16/2019 Type of visit - description: Virtual Visit via Video Note  I connected with@ on 04/16/19 at  2:00 PM EDT by a video enabled telemedicine application and verified that I am speaking with the correct person using two identifiers.   THIS ENCOUNTER IS A VIRTUAL VISIT DUE TO COVID-19 - PATIENT WAS NOT SEEN IN THE OFFICE. PATIENT HAS CONSENTED TO VIRTUAL VISIT / TELEMEDICINE VISIT   Location of patient: home  Location of provider: office  I discussed the limitations of evaluation and management by telemedicine and the availability of in person appointments. The patient expressed understanding and agreed to proceed.  History of Present Illness: Routine visit ADHD: Good compliance with medications, no concerns COVID-19: Unfortunately, his mother-in-law got sick, is in hospice right now due to COVID related pneumonia. High cholesterol: See last results, reports that he decided to go on Lipitor.  Also, concerned because his nipples got swelling, he has been taking over-the-counter testosterone boosters.  The left nipple is better, the R one is a still swollen.   Review of Systems Denies fever chills No chest pain no difficulty breathing No nausea or vomiting + Stress but no anxiety or depression per se. Very active, still exercising.   Past Medical History:  Diagnosis Date  . ADD (attention deficit disorder)   . Herniated disc, cervical    x2  . Pain, neck 2019   w/ L radiculopathy, better w/ conservative RX    Past Surgical History:  Procedure Laterality Date  . KNEE ARTHROSCOPY     R at age 60    Social History   Socioeconomic History  . Marital status: Married    Spouse name: Not on file  . Number of children: 2  . Years of education: Not on file  . Highest education level: Not on file  Occupational History  . Occupation: Therapist, nutritional:  [place]  . Financial resource strain: Not on file  . Food insecurity    Worry: Not on file    Inability: Not on file  . Transportation needs    Medical: Not on file    Non-medical: Not on file  Tobacco Use  . Smoking status: Never Smoker  . Smokeless tobacco: Never Used  Substance and Sexual Activity  . Alcohol use: Yes    Alcohol/week: 0.0 standard drinks    Comment: socially   . Drug use: No  . Sexual activity: Not on file  Lifestyle  . Physical activity    Days per week: Not on file    Minutes per session: Not on file  . Stress: Not on file  Relationships  . Social Herbalist on phone: Not on file    Gets together: Not on file    Attends religious service: Not on file    Active member of club or organization: Not on file    Attends meetings of clubs or organizations: Not on file    Relationship status: Not on file  . Intimate partner violence    Fear of current or ex partner: Not on file    Emotionally abused: Not on file    Physically abused: Not on file    Forced sexual activity: Not on file  Other Topics Concern  . Not on file  Social History Narrative   re-married  Allergies as of 04/16/2019   No Known Allergies     Medication List       Accurate as of April 16, 2019  2:14 PM. If you have any questions, ask your nurse or doctor.        amphetamine-dextroamphetamine 30 MG 24 hr capsule Commonly known as: ADDERALL XR Take 1 capsule (30 mg total) by mouth every morning.   amphetamine-dextroamphetamine 30 MG 24 hr capsule Commonly known as: ADDERALL XR Take 1 capsule (30 mg total) by mouth every morning.   atorvastatin 40 MG tablet Commonly known as: LIPITOR Take 1 tablet (40 mg total) by mouth at bedtime.   multivitamin with minerals Tabs tablet Take 1 tablet by mouth daily.   RED YEAST RICE PO Take by mouth.           Objective:   Physical Exam There were no vitals taken for this visit. This is a virtual  video visit, alert oriented x3, no apparent distress    Assessment       Assessment ADD Hyperlipidemia  PLAN ADD: Well-controlled on Adderall, refill as needed Hyperlipidemia: See last FLP, he decided to go ahead and start Lipitor, has been taking it regularly for the last few weeks.  Refill sent.  Needs a FLP and LFTs when he comes back in few weeks. Nipple discomfort: As described above, needs in person visit.  Will call and set up within the next few weeks.   I discussed the assessment and treatment plan with the patient. The patient was provided an opportunity to ask questions and all were answered. The patient agreed with the plan and demonstrated an understanding of the instructions.   The patient was advised to call back or seek an in-person evaluation if the symptoms worsen or if the condition fails to improve as anticipated.

## 2019-04-17 DIAGNOSIS — E785 Hyperlipidemia, unspecified: Secondary | ICD-10-CM | POA: Insufficient documentation

## 2019-04-17 NOTE — Assessment & Plan Note (Signed)
ADD: Well-controlled on Adderall, refill as needed Hyperlipidemia: See last FLP, he decided to go ahead and start Lipitor, has been taking it regularly for the last few weeks.  Refill sent.  Needs a FLP and LFTs when he comes back in few weeks. Nipple discomfort: As described above, needs in person visit.  Will call and set up within the next few weeks.

## 2019-04-29 ENCOUNTER — Telehealth: Payer: Self-pay | Admitting: Internal Medicine

## 2019-04-29 MED ORDER — AMPHETAMINE-DEXTROAMPHET ER 30 MG PO CP24: 30.0000 mg | ORAL_CAPSULE | ORAL | 0 refills | Status: DC

## 2019-04-29 NOTE — Telephone Encounter (Signed)
Sent!

## 2019-04-29 NOTE — Telephone Encounter (Signed)
Pt request refill   amphetamine-dextroamphetamine (ADDERALL XR) 30 MG 24 hr capsule  Pt had virtual 6/11 and was supposed to have refilled, but not at pharmacy.  [facility] #[id number] - [place], [place] [address] AT [place] [phone] (Phone) [phone] (Fax)

## 2019-06-01 ENCOUNTER — Telehealth: Payer: Self-pay | Admitting: Internal Medicine

## 2019-06-01 MED ORDER — ATORVASTATIN CALCIUM 40 MG PO TABS: 40.0000 mg | ORAL_TABLET | Freq: Every day | ORAL | 6 refills | Status: DC

## 2019-06-01 MED ORDER — AMPHETAMINE-DEXTROAMPHET ER 30 MG PO CP24: 30.0000 mg | ORAL_CAPSULE | ORAL | 0 refills | Status: DC

## 2019-06-01 NOTE — Telephone Encounter (Signed)
Rx sent 

## 2019-06-01 NOTE — Telephone Encounter (Signed)
Ok RF lipitor adderall sent

## 2019-06-01 NOTE — Telephone Encounter (Signed)
Medication Refill - Medication: atorvastatin (LIPITOR) 40 MG tablet & amphetamine-dextroamphetamine (ADDERALL XR) 30 MG 24 hr capsule      Has the patient contacted their pharmacy? No. Pt states he accidentally washed both medications and they are completely dissolved. Please advise.  (Agent: If no, request that the patient contact the pharmacy for the refill.) (Agent: If yes, when and what did the pharmacy advise?)  Preferred Pharmacy (with phone number or street name):  [id number] - [place], [place]  [place] [address]  Phone: [phone] Fax: [phone]  Not a 24 hour pharmacy; exact hours not known.     Agent: Please be advised that RX refills may take up to 3 business days. We ask that you follow-up with your pharmacy.

## 2019-06-01 NOTE — Telephone Encounter (Signed)
Please advise 

## 2019-06-30 IMAGING — XA DG INJECT/[PERSON_NAME] INC NEEDLE/CATH/PLC EPI/CERV/THOR W/IMG
2 series · 2 of 2 positions shown · non-contrast
Comparison: none

CLINICAL DATA: Spondylosis without myelopathy. Pronounced worsening
of left-sided neck and arm pain. Good response to previous
injections.

[Series 1: ortho standard · 1 of 1 slices shown (1 of 2)]
[im 1/1]
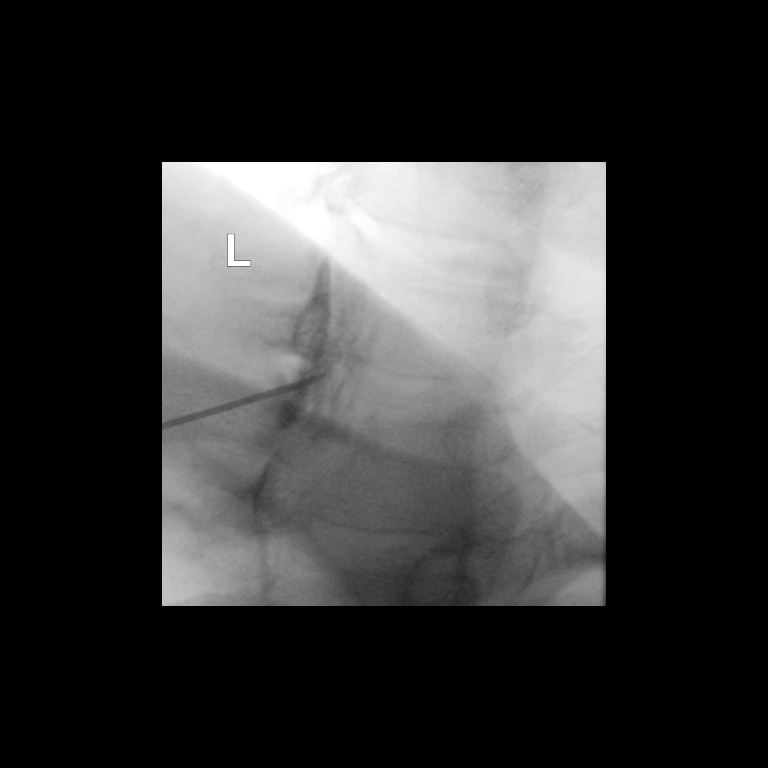

[Series 2: ortho standard · 1 of 1 slices shown (2 of 2)]
[im 1/1]
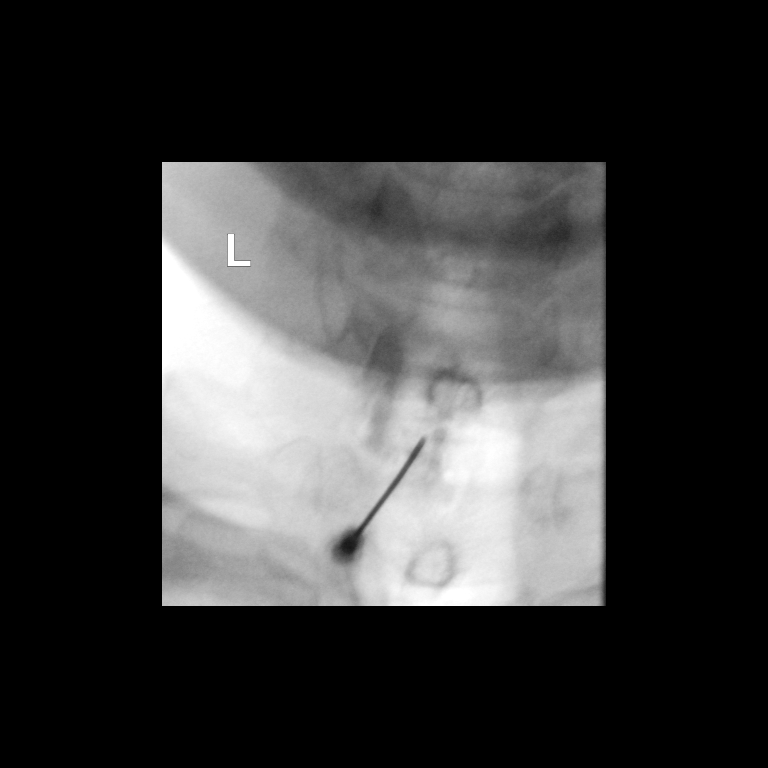

[2 of 2 positions shown; findings below may reference images not displayed]

FLUOROSCOPY TIME:  0 minutes 51 seconds. 27.73 micro gray meter
squared

PROCEDURE:
CERVICAL EPIDURAL INJECTION

An interlaminar approach was performed on the left at C7-T1. A 20
gauge epidural needle was advanced using loss-of-resistance
technique.

DIAGNOSTIC EPIDURAL INJECTION

Injection of Isovue-M 300 shows a good epidural pattern with spread
above and below the level of needle placement, primarily on the
left. No vascular opacification is seen. THERAPEUTIC

EPIDURAL INJECTION

1.5 ml of Kenalog 40 mixed with 1 ml of 1% Lidocaine and 2 ml of
normal saline were then instilled. The procedure was well-tolerated,
and the patient was discharged thirty minutes following the
injection in good condition.
IMPRESSION: Technically successful repeat epidural injection on the left at
C7-T1.

## 2019-07-06 ENCOUNTER — Telehealth: Payer: Self-pay | Admitting: Internal Medicine

## 2019-07-06 MED ORDER — AMPHETAMINE-DEXTROAMPHET ER 30 MG PO CP24: 30.0000 mg | ORAL_CAPSULE | ORAL | 0 refills | Status: DC

## 2019-07-06 NOTE — Telephone Encounter (Signed)
Sent!

## 2019-07-06 NOTE — Telephone Encounter (Signed)
Requested medication (s) are due for refill today: yes  Requested medication (s) are on the active medication list: yes  Last refill:    Future visit scheduled: no  Notes to clinic: This refill cannot be delegated   Requested Prescriptions  Pending Prescriptions Disp Refills   amphetamine-dextroamphetamine (ADDERALL XR) 30 MG 24 hr capsule 30 capsule 0    Sig: Take 1 capsule (30 mg total) by mouth every morning.     Not Delegated - Psychiatry:  Stimulants/ADHD Failed - 07/06/2019  9:06 AM      Failed - This refill cannot be delegated      Passed - Urine Drug Screen completed in last 360 days.      Passed - Valid encounter within last 3 months    Recent Outpatient Visits          2 months ago Attention deficit disorder, unspecified hyperactivity presence   [facility] [place], [place]   9 months ago Annual physical exam   [facility] [place], [place]   1 year ago Attention deficit disorder, unspecified hyperactivity presence   [facility] at [place], [place]   1 year ago Annual physical exam   [facility] at [place], [place]   2 years ago Attention deficit disorder, unspecified hyperactivity presence   [facility] at [place], [place]

## 2019-07-06 NOTE — Telephone Encounter (Signed)
Adderall refill.   Last OV: 04/16/2019 Last Fill: 06/01/2019 #30 and 0RF UDS: 10/08/2018 Low risk

## 2019-07-06 NOTE — Telephone Encounter (Signed)
amphetamine-dextroamphetamine (ADDERALL XR) 30 MG 24 hr capsule    [facility]  Pt is completely out

## 2019-08-06 ENCOUNTER — Telehealth: Payer: Self-pay | Admitting: Internal Medicine

## 2019-08-06 MED ORDER — AMPHETAMINE-DEXTROAMPHET ER 30 MG PO CP24: 30.0000 mg | ORAL_CAPSULE | ORAL | 0 refills | Status: DC

## 2019-08-06 MED ORDER — AMPHETAMINE-DEXTROAMPHET ER 30 MG PO CP24
30.0000 mg | ORAL_CAPSULE | ORAL | 0 refills | Status: DC
Start: 2019-08-06 — End: 2019-11-24

## 2019-08-06 NOTE — Telephone Encounter (Signed)
Requested medication (s) are due for refill today: yes  Requested medication (s) are on the active medication list: yes  Last refill:  07/06/2019  Future visit scheduled: yes  Notes to clinic:  Refill cannot be delegated    Requested Prescriptions  Pending Prescriptions Disp Refills   amphetamine-dextroamphetamine (ADDERALL XR) 30 MG 24 hr capsule 30 capsule 0    Sig: Take 1 capsule (30 mg total) by mouth every morning.     Not Delegated - Psychiatry:  Stimulants/ADHD Failed - 08/06/2019  8:51 AM      Failed - This refill cannot be delegated      Failed - Valid encounter within last 3 months    Recent Outpatient Visits          3 months ago Attention deficit disorder, unspecified hyperactivity presence   [facility] at [place], MD   10 months ago Annual physical exam   [facility] at [place], [place]   1 year ago Attention deficit disorder, unspecified hyperactivity presence   [facility] at [place], MD   1 year ago Annual physical exam   [facility] at [place], [place]   2 years ago Attention deficit disorder, unspecified hyperactivity presence   [facility] at [place], [place]             Passed - Urine Drug Screen completed in last 360 days.

## 2019-08-06 NOTE — Telephone Encounter (Signed)
Adderall refill.   Last OV: 04/16/2019 Last Fill: 07/06/2019 #30 and 0RF UDS: 10/08/2018 Low risk

## 2019-08-06 NOTE — Telephone Encounter (Signed)
sent 

## 2019-08-06 NOTE — Telephone Encounter (Signed)
Medication Refill - Medication: amphetamine-dextroamphetamine (ADDERALL XR) 30 MG 24 hr capsule  Has the patient contacted their pharmacy? No. (Agent: If no, request that the patient contact the pharmacy for the refill.) (Agent: If yes, when and what did the pharmacy advise?)  Preferred Pharmacy (with phone number or street name):  [facility] #[id number] - [place], [place] [phone] (Phone) [phone] (Fax)     Agent: Please be advised that RX refills may take up to 3 business days. We ask that you follow-up with your pharmacy.

## 2019-09-08 ENCOUNTER — Telehealth: Payer: Self-pay | Admitting: Internal Medicine

## 2019-09-08 ENCOUNTER — Other Ambulatory Visit: Payer: Self-pay

## 2019-09-08 NOTE — Telephone Encounter (Signed)
Patient requesting amphetamine-dextroamphetamine (ADDERALL XR) 30 MG 24 hr capsule, informed please allow 48 to 72 hour turn around time  [place] AT [place] [phone] (Phone) [phone] (Fax)

## 2019-09-08 NOTE — Telephone Encounter (Signed)
[facility]/ [facility]- they have 1 Rx on file already- will get ready for him.

## 2019-09-09 ENCOUNTER — Encounter: Payer: Self-pay | Admitting: Internal Medicine

## 2019-09-09 ENCOUNTER — Ambulatory Visit: Payer: No Typology Code available for payment source | Admitting: Internal Medicine

## 2019-09-09 VITALS — BP 128/91 | HR 92 | Temp 97.3°F | Resp 16 | Ht 66.0 in | Wt 172.5 lb

## 2019-09-09 DIAGNOSIS — F988 Other specified behavioral and emotional disorders with onset usually occurring in childhood and adolescence: Secondary | ICD-10-CM | POA: Diagnosis not present

## 2019-09-09 DIAGNOSIS — E785 Hyperlipidemia, unspecified: Secondary | ICD-10-CM

## 2019-09-09 DIAGNOSIS — N62 Hypertrophy of breast: Secondary | ICD-10-CM

## 2019-09-09 DIAGNOSIS — Z79899 Other long term (current) drug therapy: Secondary | ICD-10-CM

## 2019-09-09 NOTE — Progress Notes (Signed)
wPre visit review using our clinic review tool, if applicable. No additional management support is needed unless otherwise documented below in the visit note.

## 2019-09-09 NOTE — Patient Instructions (Addendum)
  GO TO THE FRONT DESK Schedule labs to be done fasting at your earliest convenience  Schedule your next appointment   for a physical exam in 2 to 3 months  We are referring you to have a mammogram, expect a phone call  We are referring you to endocrinology.  They should be calling you soon   Stop all supplements

## 2019-09-09 NOTE — Progress Notes (Signed)
Subjective:    Patient ID: Kyle Dunn, male    DOB: [birth year], 48 y.o.   MRN: [id number]  DOS:  09/09/2019 Type of visit - description: Acute Few months ago, the patient felt some discomfort and swelling in both nipples. At the time he was taking a testosterone booster. He is stopped it. Since then, the left nipple is back to normal. The right nipple is a still sore and he feels a lump under it.   Review of Systems No headaches. No anxiety or depression Denies any nipple discharge.  No rash  Past Medical History:  Diagnosis Date  . ADD (attention deficit disorder)   . Herniated disc, cervical    x2  . Pain, neck 2019   w/ L radiculopathy, better w/ conservative RX    Past Surgical History:  Procedure Laterality Date  . KNEE ARTHROSCOPY     R at age 86    Social History   Socioeconomic History  . Marital status: Married    Spouse name: Not on file  . Number of children: 2  . Years of education: Not on file  . Highest education level: Not on file  Occupational History  . Occupation: Ecologist: [employer]  Social Needs  . Financial resource strain: Not on file  . Food insecurity    Worry: Not on file    Inability: Not on file  . Transportation needs    Medical: Not on file    Non-medical: Not on file  Tobacco Use  . Smoking status: Never Smoker  . Smokeless tobacco: Never Used  Substance and Sexual Activity  . Alcohol use: Yes    Alcohol/week: 0.0 standard drinks    Comment: socially   . Drug use: No  . Sexual activity: Not on file  Lifestyle  . Physical activity    Days per week: Not on file    Minutes per session: Not on file  . Stress: Not on file  Relationships  . Social Musician on phone: Not on file    Gets together: Not on file    Attends religious service: Not on file    Active member of club or organization: Not on file    Attends meetings of clubs or organizations: Not on file    Relationship status:  Not on file  . Intimate partner violence    Fear of current or ex partner: Not on file    Emotionally abused: Not on file    Physically abused: Not on file    Forced sexual activity: Not on file  Other Topics Concern  . Not on file  Social History Narrative   re-married          Allergies as of 09/09/2019   No Known Allergies     Medication List       Accurate as of September 09, 2019 11:39 AM. If you have any questions, ask your nurse or doctor.        amphetamine-dextroamphetamine 30 MG 24 hr capsule Commonly known as: ADDERALL XR Take 1 capsule (30 mg total) by mouth every morning.   atorvastatin 40 MG tablet Commonly known as: LIPITOR Take 1 tablet (40 mg total) by mouth at bedtime.   multivitamin with minerals Tabs tablet Take 1 tablet by mouth daily.   RED YEAST RICE PO Take by mouth.           Objective:   Physical Exam BP Marland Kitchen)  128/91 (BP Location: Left Arm, Patient Position: Sitting, Cuff Size: Normal)   Pulse 92   Temp (!) 97.3 F (36.3 C) (Temporal)   Resp 16   Ht 5\' 6"  (1.676 m)   Wt 172 lb 8 oz (78.2 kg)   SpO2 98%   BMI 27.84 kg/m  General:   Well developed, NAD, BMI noted. HEENT:  Normocephalic . Face symmetric, atraumatic Lymphatic system: No LAD is at the neck, supraclavicular areas or axillary areas Breasts: Left side normal Right side: Nipple normal, no discharge.  Underneath, he does have slightly lumpy, nontender breast tissue, approximately 1.5 x 1 inches in size. Skin: Not pale. Not jaundice Neurologic:  alert & oriented X3.  Speech normal, gait appropriate for age and unassisted Psych--  Cognition and judgment appear intact.  Cooperative with normal attention span and concentration.  Behavior appropriate. No anxious or depressed appearing.      Assessment    Assessment ADD Hyperlipidemia  PLAN Gynecomastia: Unclear etiology.  He was taking "testosterone booster" OTC  that he stopped already.  Recommend to continue  taking his routine medicines but to stop any other supplements. Get a mammogram and ultrasound Endo referral for further eval. High cholesterol: Several months ago decided to start taking Lipitor, no apparent side effects, check a FLP, AST, ALT. Preventive care: Declined a flu shot RTC CPX 2 to 3 months

## 2019-09-10 NOTE — Assessment & Plan Note (Signed)
Gynecomastia: Unclear etiology.  He was taking "testosterone booster" OTC  that he stopped already.  Recommend to continue taking his routine medicines but to stop any other supplements. Get a mammogram and ultrasound Endo referral for further eval. High cholesterol: Several months ago decided to start taking Lipitor, no apparent side effects, check a FLP, AST, ALT. Preventive care: Declined a flu shot RTC CPX 2 to 3 months

## 2019-09-18 ENCOUNTER — Other Ambulatory Visit: Payer: Self-pay | Admitting: Family Medicine

## 2019-09-22 ENCOUNTER — Ambulatory Visit: Payer: No Typology Code available for payment source

## 2019-09-22 ENCOUNTER — Ambulatory Visit
Admission: RE | Admit: 2019-09-22 | Discharge: 2019-09-22 | Disposition: A | Discharge status: Home / Self Care | Payer: No Typology Code available for payment source | Source: Ambulatory Visit | Attending: Internal Medicine | Admitting: Internal Medicine

## 2019-09-22 ENCOUNTER — Other Ambulatory Visit: Payer: Self-pay

## 2019-09-25 ENCOUNTER — Other Ambulatory Visit: Payer: No Typology Code available for payment source

## 2019-10-06 ENCOUNTER — Telehealth: Payer: Self-pay | Admitting: Internal Medicine

## 2019-10-15 ENCOUNTER — Other Ambulatory Visit: Payer: Self-pay

## 2019-10-15 ENCOUNTER — Ambulatory Visit (INDEPENDENT_AMBULATORY_CARE_PROVIDER_SITE_OTHER): Payer: No Typology Code available for payment source | Admitting: Endocrinology

## 2019-10-15 ENCOUNTER — Encounter: Payer: Self-pay | Admitting: Endocrinology

## 2019-10-15 DIAGNOSIS — N62 Hypertrophy of breast: Secondary | ICD-10-CM | POA: Diagnosis not present

## 2019-10-15 NOTE — Patient Instructions (Addendum)
Blood tests are requested for you today.  We'll let you know about the results.  If these are OK, I can prescribe for you a pill to reduce the swelling.  It is best to stay off the "testosterone booster." Please come back for a follow-up appointment in 1 year.

## 2019-10-15 NOTE — Progress Notes (Signed)
Subjective:    Patient ID: Kyle Dunn, male    DOB: 08/26/1971, 48 y.o.   MRN: [id number]  HPI Pt is referred by Dr Larose Kells, for gynecomastia.  Pt was the product of a normal pregnancy and delivery.  he had puberty at the normal age.  He has 2 biological children. He has never been diagnosed with hypogonadism.  He denies any h/o infertility.  He has never had liver disease, kidney disease, cancer, cystic fibrosis, ulcerative colitis, alcoholism, or BPH.  He has never taken cimetidine, calcium channel blockers, growth hormone, risperidone, hCG, androgens, 5-alpha-reductase inhibitors, cancer chemotherapy, estrogens, or ketoconazole.  He now reports 10 mos of moderate swelling at both breasts (R>L), and slight assoc pain.  No recent opioids.  He takes "testosterone booster" pills x 4 years.  He stopped 2 mos ago.   Past Medical History:  Diagnosis Date  . ADD (attention deficit disorder)   . Herniated disc, cervical    x2  . Pain, neck 2019   w/ L radiculopathy, better w/ conservative RX    Past Surgical History:  Procedure Laterality Date  . KNEE ARTHROSCOPY     R at age 24    Social History   Socioeconomic History  . Marital status: Married    Spouse name: Not on file  . Number of children: 2  . Years of education: Not on file  . Highest education level: Not on file  Occupational History  . Occupation: Therapist, nutritional: OTHER  Tobacco Use  . Smoking status: Never Smoker  . Smokeless tobacco: Never Used  Substance and Sexual Activity  . Alcohol use: Yes    Alcohol/week: 0.0 standard drinks    Comment: socially   . Drug use: No  . Sexual activity: Not on file  Other Topics Concern  . Not on file  Social History Narrative   re-married       Social Determinants of Health   Financial Resource Strain:   . Difficulty of Paying Living Expenses: Not on file  Food Insecurity:   . Worried About Charity fundraiser in the Last Year: Not on file  . Ran Out of  Food in the Last Year: Not on file  Transportation Needs:   . Lack of Transportation (Medical): Not on file  . Lack of Transportation (Non-Medical): Not on file  Physical Activity:   . Days of Exercise per Week: Not on file  . Minutes of Exercise per Session: Not on file  Stress:   . Feeling of Stress : Not on file  Social Connections:   . Frequency of Communication with Friends and Family: Not on file  . Frequency of Social Gatherings with Friends and Family: Not on file  . Attends Religious Services: Not on file  . Active Member of Clubs or Organizations: Not on file  . Attends Archivist Meetings: Not on file  . Marital Status: Not on file  Intimate Partner Violence:   . Fear of Current or Ex-Partner: Not on file  . Emotionally Abused: Not on file  . Physically Abused: Not on file  . Sexually Abused: Not on file    Current Outpatient Medications on File Prior to Visit  Medication Sig Dispense Refill  . amphetamine-dextroamphetamine (ADDERALL XR) 30 MG 24 hr capsule Take 1 capsule (30 mg total) by mouth every morning. 30 capsule 0  . ascorbic acid (VITAMIN C) 1000 MG tablet Take 1,000 mg by mouth daily.    Marland Kitchen  atorvastatin (LIPITOR) 40 MG tablet Take 1 tablet (40 mg total) by mouth at bedtime. 30 tablet 6  . Calcium 200 MG TABS Take 1 tablet by mouth daily.    . calcium-vitamin D (OSCAL WITH D) 500-200 MG-UNIT tablet Take 1 tablet by mouth daily.    . CHELATED ZINC PO Take 1 tablet by mouth daily.    . Iodine Strong, Lug[place], (IODINE STRONG PO) Take 1 tablet by mouth daily.    . L-FORMULA LYSINE HCL PO Take 1 tablet by mouth daily.    . Multiple Vitamin (MULTIVITAMIN WITH MINERALS) TABS tablet Take 1 tablet by mouth daily.    . NON FORMULARY Bio-actine silver hydrosol    . OVER THE COUNTER MEDICATION Blubonnet maxi one    . Probiotic Product (PROBIOTIC-10 PO) Take 1 tablet by mouth daily.    . Red Yeast Rice Extract (RED YEAST RICE PO) Take by mouth.     No current  facility-administered medications on file prior to visit.    No Known Allergies  Family History  Problem Relation Age of Onset  . Colon cancer Father        dx in his 29s  . Alcohol abuse Father   . CAD Neg Hx   . Diabetes Neg Hx   . Prostate cancer Neg Hx   . Other Neg Hx        gynecomastia    BP 130/70 (BP Location: Right Arm, Patient Position: Sitting, Cuff Size: Normal)   Pulse 97   Ht 5\' 6"  (1.676 m)   Wt 178 lb (80.7 kg)   SpO2 98%   BMI 28.73 kg/m   Review of Systems denies depression, numbness, erectile dysfunction, weight change, decreased urinary stream, muscle weakness, fever, headache, easy bruising, sob, rash, blurry vision, rhinorrhea, and chest pain.       Objective:   Physical Exam VS: see vs page GEN: no distress HEAD: head: no deformity eyes: no periorbital swelling, no proptosis external nose and ears are normal NECK: supple, thyroid is not enlarged CHEST WALL: no deformity LUNGS: clear to auscultation BREASTS:  approx 2 cm of right gynecomastia (none on the left).  CV: reg rate and rhythm, no murmur ABD: abdomen is soft, nontender.  no hepatosplenomegaly.  not distended.  no hernia GENITALIA:  Normal male.   MUSCULOSKELETAL: muscle bulk and strength are grossly normal.  no obvious joint swelling.  gait is normal and steady.   EXTEMITIES: no deformity.  no leg edema PULSES: no carotid bruit NEURO:  cn 2-12 grossly intact.   readily moves all 4's.  sensation is intact to touch on all 4's.   SKIN:  Normal texture and temperature.  No rash or suspicious lesion is visible.  Normal hair distribution.   NODES:  None palpable at the neck.   PSYCH: alert, well-oriented.  Does not appear anxious nor depressed.   2020: Asymmetric benign gynecomastia involving the retroareolar right breast.    I have reviewed outside records, and summarized: Pt was noted to have gynecomastia, and referred here.  Pt was advised to stay off "testosterone booster."   Dyslipidemia was also addressed.     Assessment & Plan:  Gynecomastia, new to me, uncertain etiology.   Patient Instructions  Blood tests are requested for you today.  We'll let you know about the results.  If these are OK, I can prescribe for you a pill to reduce the swelling.  It is best to stay off the "testosterone booster." Please come  back for a follow-up appointment in 1 year.

## 2019-11-09 ENCOUNTER — Encounter: Payer: No Typology Code available for payment source | Admitting: Internal Medicine

## 2019-11-19 ENCOUNTER — Telehealth: Payer: Self-pay | Admitting: Internal Medicine

## 2019-11-19 NOTE — Telephone Encounter (Signed)
Pt needs adderall xr 30 mg. [facility] [address] in [place]

## 2019-11-24 MED ORDER — AMPHETAMINE-DEXTROAMPHET ER 30 MG PO CP24: 30.0000 mg | ORAL_CAPSULE | ORAL | 0 refills | Status: DC

## 2019-11-24 NOTE — Telephone Encounter (Signed)
Pt calling back to check the status of refill on Adderall.

## 2019-11-24 NOTE — Telephone Encounter (Signed)
Requested medication (s) are due for refill today: yes  Requested medication (s) are on the active medication list: yes  Last refill:  08/06/2019  Future visit scheduled: no  Notes to clinic:  not delegated   Requested Prescriptions  Pending Prescriptions Disp Refills   amphetamine-dextroamphetamine (ADDERALL XR) 30 MG 24 hr capsule 30 capsule 0    Sig: Take 1 capsule (30 mg total) by mouth every morning.      There is no refill protocol information for this order

## 2019-11-24 NOTE — Telephone Encounter (Signed)
Sent!

## 2019-11-24 NOTE — Telephone Encounter (Signed)
Adderall refill.   Last OV: 04/16/2019 Last Fill: 08/06/2019 #30 and 0RF UDS: 10/08/2018 Low risk

## 2020-01-01 ENCOUNTER — Other Ambulatory Visit: Payer: Self-pay

## 2020-01-01 ENCOUNTER — Telehealth: Payer: Self-pay | Admitting: Internal Medicine

## 2020-01-01 ENCOUNTER — Other Ambulatory Visit (INDEPENDENT_AMBULATORY_CARE_PROVIDER_SITE_OTHER): Payer: No Typology Code available for payment source

## 2020-01-01 DIAGNOSIS — Z79899 Other long term (current) drug therapy: Secondary | ICD-10-CM

## 2020-01-01 DIAGNOSIS — E785 Hyperlipidemia, unspecified: Secondary | ICD-10-CM

## 2020-01-01 DIAGNOSIS — F988 Other specified behavioral and emotional disorders with onset usually occurring in childhood and adolescence: Secondary | ICD-10-CM

## 2020-01-01 NOTE — Telephone Encounter (Signed)
No showed for visit on 11/09/2019 for physical exam and Pt never came back in November to have labs drawn- needs labs for refills.

## 2020-01-01 NOTE — Addendum Note (Signed)
Addended by: Mervin Kung A on: 01/01/2020 02:54 PM   Modules accepted: Orders

## 2020-01-01 NOTE — Telephone Encounter (Signed)
Called patient back left voice message to schedule appointment for labs.

## 2020-01-01 NOTE — Addendum Note (Signed)
Addended by: Mervin Kung A on: 01/01/2020 02:55 PM   Modules accepted: Orders

## 2020-01-01 NOTE — Telephone Encounter (Signed)
Medication: amphetamine-dextroamphetamine (ADDERALL XR) 30 MG 24 hr capsule  ° °Has the patient contacted their pharmacy? No. °(If no, request that the patient contact the pharmacy for the refill.) °(If yes, when and what did the pharmacy advise?) ° °Preferred Pharmacy (with phone number or street name):  °[id number] - [place], [place] - [address]  °[address], [place] [address]  °Phone:  [phone]  Fax:  [phone]  ° °Agent: Please be advised that RX refills may take up to 3 business days. We ask that you follow-up with your pharmacy.  °

## 2020-01-03 LAB — LIPID PANEL
Cholesterol: 274 mg/dL — ABNORMAL HIGH (ref <200)
HDL: 59 mg/dL (ref >=40)
LDL Cholesterol (Calc): 176 mg/dL (calc) — ABNORMAL HIGH
Non-HDL Cholesterol (Calc): 215 mg/dL (calc) — ABNORMAL HIGH (ref <130)
Total CHOL/HDL Ratio: 4.6 (calc) (ref <5.0)
Triglycerides: 230 mg/dL — ABNORMAL HIGH (ref <150)

## 2020-01-03 LAB — PAIN MGMT, PROFILE 8 W/CONF, U
6 Acetylmorphine: NEGATIVE ng/mL
Alcohol Metabolites: POSITIVE ng/mL — AB (ref <500)
Amphetamine: 1520 ng/mL
Amphetamines: POSITIVE ng/mL
Benzodiazepines: NEGATIVE ng/mL
Buprenorphine, Urine: NEGATIVE ng/mL
Cocaine Metabolite: NEGATIVE ng/mL
Creatinine: 67 mg/dL
Ethyl Glucuronide (ETG): 1057 ng/mL
Ethyl Sulfate (ETS): 367 ng/mL
MDMA: NEGATIVE ng/mL
Marijuana Metabolite: NEGATIVE ng/mL
Methamphetamine: NEGATIVE ng/mL
Opiates: NEGATIVE ng/mL
Oxidant: NEGATIVE ug/mL
Oxycodone: NEGATIVE ng/mL
pH: 6.3 (ref 4.5–9.0)

## 2020-01-03 LAB — ALT: ALT: 27 U/L (ref 9–46)

## 2020-01-03 LAB — AST: AST: 21 U/L (ref 10–40)

## 2020-01-08 MED ORDER — EZETIMIBE 10 MG PO TABS: 10.0000 mg | ORAL_TABLET | Freq: Every day | ORAL | 3 refills | Status: DC

## 2020-01-08 NOTE — Addendum Note (Signed)
Addended byConrad Minburn D on: 01/08/2020 12:43 PM   Modules accepted: Orders

## 2020-02-08 ENCOUNTER — Telehealth: Payer: Self-pay

## 2020-02-08 MED ORDER — AMPHETAMINE-DEXTROAMPHET ER 30 MG PO CP24: 30.0000 mg | ORAL_CAPSULE | ORAL | 0 refills | Status: DC

## 2020-02-08 NOTE — Telephone Encounter (Signed)
Patient called in to see if Dr. Drue Novel could send in a prescription for amphetamine-dextroamphetamine (ADDERALL XR) 30 MG 24 hr capsule [[id number]]    Please send it to  [id number] - [place], [place] - [address] AT [facility] [address]  [address], [place] [address]  Phone:  [phone] Fax:  [phone]  DEA #:  [id number]

## 2020-02-08 NOTE — Telephone Encounter (Signed)
Adderall refill.   Last OV: 09/09/2019 Last Fill: 11/24/2019 #30 and 0RF Pt sig: 1 capsule po qd UDS: 01/01/2020 Low risk

## 2020-02-08 NOTE — Telephone Encounter (Signed)
Prescriptions x 2  sent, PDMP okay

## 2020-03-14 ENCOUNTER — Telehealth: Payer: Self-pay | Admitting: Internal Medicine

## 2020-03-14 NOTE — Telephone Encounter (Signed)
Medication: amphetamine-dextroamphetamine (ADDERALL XR) 30 MG 24 hr capsule [[id number]   Has the patient contacted their pharmacy? No. (If no, request that the patient contact the pharmacy for the refill.) (If yes, when and what did the pharmacy advise?)  Preferred Pharmacy (with phone number or street name): [id number] - [place], [place] - [address] AT [facility] [address]  [address], [place] [address]  Phone:  [phone] Fax:  [phone]  DEA #:  [id number]  Agent: Please be advised that RX refills may take up to 3 business days. We ask that you follow-up with your pharmacy.

## 2020-03-14 NOTE — Telephone Encounter (Signed)
Adderall for May 2021 already on file at [facility]. Pt should call them directly for refills.

## 2020-04-13 ENCOUNTER — Telehealth: Payer: Self-pay

## 2020-04-13 MED ORDER — AMPHETAMINE-DEXTROAMPHET ER 30 MG PO CP24: 30.0000 mg | ORAL_CAPSULE | ORAL | 0 refills | Status: DC

## 2020-04-13 NOTE — Telephone Encounter (Signed)
Left message for the patient to call back to schedule appointment. 

## 2020-04-13 NOTE — Telephone Encounter (Signed)
Rx sent, please check to be sure that next appointment is a CPX

## 2020-04-13 NOTE — Telephone Encounter (Signed)
Appt actually w/ endo in December. Mitzo- please call Pt he is overdue for CPE- needs that before next refill (Paz did send refill today). TY.

## 2020-04-13 NOTE — Telephone Encounter (Signed)
Adderall refill.   Last OV: 09/09/2019, appt scheduled 10/20/2020 Last Fill: 4/5/202 #30 and 0RF Pt sig: 1 capsule qd UDS: 01/01/2020 Low risk

## 2020-04-13 NOTE — Telephone Encounter (Signed)
Patient called in to get a prescription refill for amphetamine-dextroamphetamine (ADDERALL XR) 30 MG 24 hr capsule [[id number]]    Please send it to [id number] - [place], [place] - [address] AT [facility] [address]  [address], [place] [address]  Phone:  [phone] Fax:  [phone]  DEA #:  [id number]

## 2020-05-24 ENCOUNTER — Other Ambulatory Visit: Payer: Self-pay

## 2020-05-24 ENCOUNTER — Encounter: Payer: Self-pay | Admitting: Internal Medicine

## 2020-05-24 ENCOUNTER — Ambulatory Visit: Payer: No Typology Code available for payment source | Admitting: Internal Medicine

## 2020-05-24 VITALS — BP 120/76 | HR 86 | Temp 98.0°F | Resp 18 | Ht 66.0 in | Wt 179.2 lb

## 2020-05-24 DIAGNOSIS — F988 Other specified behavioral and emotional disorders with onset usually occurring in childhood and adolescence: Secondary | ICD-10-CM

## 2020-05-24 DIAGNOSIS — E785 Hyperlipidemia, unspecified: Secondary | ICD-10-CM

## 2020-05-24 DIAGNOSIS — Z800 Family history of malignant neoplasm of digestive organs: Secondary | ICD-10-CM

## 2020-05-24 DIAGNOSIS — N62 Hypertrophy of breast: Secondary | ICD-10-CM

## 2020-05-24 MED ORDER — AMPHETAMINE-DEXTROAMPHET ER 30 MG PO CP24: 30.0000 mg | ORAL_CAPSULE | ORAL | 0 refills | Status: DC

## 2020-05-24 NOTE — Patient Instructions (Signed)
   GO TO THE FRONT DESK, PLEASE SCHEDULE YOUR APPOINTMENTS Come back for fasting, for blood work in approximately 2 weeks  Come back for a physical exam in 5 to 6 months

## 2020-05-24 NOTE — Progress Notes (Signed)
Subjective:    Patient ID: Kyle Dunn, male    DOB: [birth year], 49 y.o.   MRN: [id number]  DOS:  05/24/2020 Type of visit - description: Follow-up, multiple issues discussed ADD: Needs a refill Gynecomastia: Note from Endo reviewed, mammogram reviewed.  Did not pursue labs.  Right breast continues to be slightly enlarged somewhat tender.  No nipple discharge High cholesterol: Reports today he was not on atorvastatin when the last labs were done   Review of Systems See above   Past Medical History:  Diagnosis Date  . ADD (attention deficit disorder)   . Herniated disc, cervical    x2  . Pain, neck 2019   w/ L radiculopathy, better w/ conservative RX    Past Surgical History:  Procedure Laterality Date  . KNEE ARTHROSCOPY     R at age 32    Allergies as of 05/24/2020   No Known Allergies     Medication List       Accurate as of May 24, 2020 11:59 PM. If you have any questions, ask your nurse or doctor.        STOP taking these medications   atorvastatin 40 MG tablet Commonly known as: LIPITOR Stopped by: Willow Ora, MD   CHELATED ZINC PO Stopped by: Willow Ora, MD   ezetimibe 10 MG tablet Commonly known as: Zetia Stopped by: Willow Ora, MD     TAKE these medications   amphetamine-dextroamphetamine 30 MG 24 hr capsule Commonly known as: ADDERALL XR Take 1 capsule (30 mg total) by mouth every morning.   ascorbic acid 1000 MG tablet Commonly known as: VITAMIN C Take 1,000 mg by mouth daily.   Calcium 200 MG Tabs Take 1 tablet by mouth daily.   calcium-vitamin D 500-200 MG-UNIT tablet Commonly known as: OSCAL WITH D Take 1 tablet by mouth daily.   IODINE STRONG PO Take 1 tablet by mouth daily.   L-FORMULA LYSINE HCL PO Take 1 tablet by mouth daily.   multivitamin with minerals Tabs tablet Take 1 tablet by mouth daily.   NON FORMULARY Bio-actine silver hydrosol   OVER THE COUNTER MEDICATION Blubonnet maxi one   PROBIOTIC-10 PO Take  1 tablet by mouth daily.   RED YEAST RICE PO Take by mouth.          Objective:   Physical Exam BP 120/76 (BP Location: Left Arm, Patient Position: Sitting, Cuff Size: Small)   Pulse 86   Temp 98 F (36.7 C) (Oral)   Resp 18   Ht 5\' 6"  (1.676 m)   Wt 179 lb 4 oz (81.3 kg)   SpO2 99%   BMI 28.93 kg/m  General:   Well developed, NAD, BMI noted. HEENT:  Normocephalic . Face symmetric, atraumatic Breasts: Left side normal, essentially no breast tissue was palpated Right side: Nipple normal, no discharge.  Underneath, he does have slightly lumpy, nontender breast tissue, approximately 1 x 1 inches in size. No axillary lymphadenopathy Lungs:  CTA B Normal respiratory effort, no intercostal retractions, no accessory muscle use. Heart: RRR,  no murmur.  Lower extremities: no pretibial edema bilaterally  Skin: Not pale. Not jaundice Neurologic:  alert & oriented X3.  Speech normal, gait appropriate for age and unassisted Psych--  Cognition and judgment appear intact.  Cooperative with normal attention span and concentration.  Behavior appropriate. No anxious or depressed appearing.      Assessment     Assessment ADD Hyperlipidemia  PLAN  ADD: Well-controlled, refilled Adderall  Gynecomastia:  See last visit, mammogram showed no malignancy, Saw Endo December 2020, labs were ordered but never done. On self-exam the gynecomastia is less pronounce, clinical exam is confirmatory Plan: Proceed with labs as ordered by endocrinology. Hyperlipidemia: Labs were done 01/01/2020, LDL was 176, I was under the impression that he was taking atorvastatin thus Zetia was added because LDL was still elevated. Today he reports that he stopped Lipitor on December 2020 and never started Zetia.  He is doing great with diet and he is very active.  We agreed to check a CMP and FLP, further advised with results. Preventive care: Hesitant to get the Covid vaccination, patient educated, pro >>  cons CCS: Recommend GI referral, patient told in the past his father had colon cancer at age 39. RTC for fasting labs in 2 weeks, the earliest time he can return RTC CPX 5 to 6 months    This visit occurred during the SARS-CoV-2 public health emergency.  Safety protocols were in place, including screening questions prior to the visit, additional usage of staff PPE, and extensive cleaning of exam room while observing appropriate contact time as indicated for disinfecting solutions.

## 2020-05-24 NOTE — Progress Notes (Signed)
Pre visit review using our clinic review tool, if applicable. No additional management support is needed unless otherwise documented below in the visit note. 

## 2020-05-25 NOTE — Assessment & Plan Note (Signed)
ADD: Well-controlled, refilled Adderall Gynecomastia:  See last visit, mammogram showed no malignancy, Saw Endo December 2020, labs were ordered but never done. On self-exam the gynecomastia is less pronounce, clinical exam is confirmatory Plan: Proceed with labs as ordered by endocrinology. Hyperlipidemia: Labs were done 01/01/2020, LDL was 176, I was under the impression that he was taking atorvastatin thus Zetia was added because LDL was still elevated. Today he reports that he stopped Lipitor on December 2020 and never started Zetia.  He is doing great with diet and he is very active.  We agreed to check a CMP and FLP, further advised with results. Preventive care: Hesitant to get the Covid vaccination, patient educated, pro >> cons CCS: Recommend GI referral, patient told in the past his father had colon cancer at age 61. RTC for fasting labs in 2 weeks, the earliest time he can return RTC CPX 5 to 6 months

## 2020-06-07 ENCOUNTER — Other Ambulatory Visit: Payer: No Typology Code available for payment source

## 2020-06-30 ENCOUNTER — Telehealth: Payer: Self-pay | Admitting: Internal Medicine

## 2020-06-30 MED ORDER — AMPHETAMINE-DEXTROAMPHET ER 30 MG PO CP24: 30.0000 mg | ORAL_CAPSULE | ORAL | 0 refills | Status: DC

## 2020-06-30 NOTE — Telephone Encounter (Signed)
Medication: amphetamine-dextroamphetamine (ADDERALL XR) 30 MG 24 hr capsule [[id number]]    Has the patient contacted their pharmacy? No. (If no, request that the patient contact the pharmacy for the refill.) (If yes, when and what did the pharmacy advise?)  Preferred Pharmacy (with phone number or street name): [id number] - [place], [place] - [address] AT [facility] [address]  [address], [place] [address]  Phone:  [phone] Fax:  [phone]  DEA #:  [id number]  Agent: Please be advised that RX refills may take up to 3 business days. We ask that you follow-up with your pharmacy.

## 2020-06-30 NOTE — Telephone Encounter (Signed)
Last written: 05/24/20 Last ov: 05/24/20 Next ov: 10/20/20 Contract:  UDS: 01/01/20

## 2020-06-30 NOTE — Telephone Encounter (Signed)
Rx sent x2, PDMP okay

## 2020-08-08 ENCOUNTER — Telehealth: Payer: Self-pay | Admitting: Internal Medicine

## 2020-08-08 MED ORDER — AMPHETAMINE-DEXTROAMPHET ER 30 MG PO CP24
30.0000 mg | ORAL_CAPSULE | ORAL | 0 refills | Status: DC
Start: 2020-08-08 — End: 2020-08-08

## 2020-08-08 MED ORDER — AMPHETAMINE-DEXTROAMPHET ER 30 MG PO CP24
30.0000 mg | ORAL_CAPSULE | ORAL | 0 refills | Status: DC
Start: 2020-08-08 — End: 2020-09-15

## 2020-08-08 NOTE — Telephone Encounter (Signed)
Requesting: Adderall XR 30mg  Contract: 10/08/2018 UDS: 01/01/2020 Low risk Last Visit: 05/24/2020 Next Visit: None scheduled Last Refill: 06/30/2020 #30 and 0RF Pt sig: 1 capsule qd  Please Advise

## 2020-08-08 NOTE — Telephone Encounter (Signed)
I will send request to PCP, but Pt never came back for labs 05/2020. Please schedule at his earliest convenience. Thank you.

## 2020-08-08 NOTE — Telephone Encounter (Signed)
PDMP okay, Rx sent 

## 2020-08-08 NOTE — Telephone Encounter (Signed)
Medication: amphetamine-dextroamphetamine (ADDERALL XR) 30 MG 24 hr capsule [[id number]]   Has the patient contacted their pharmacy? No. (If no, request that the patient contact the pharmacy for the refill.) (If yes, when and what did the pharmacy advise?)  Preferred Pharmacy (with phone number or street name):  [id number] - [place], [place] - [address] AT [facility] OF [address] Phone:  [phone]  Fax:  [phone]       Agent: Please be advised that RX refills may take up to 3 business days. We ask that you follow-up with your pharmacy.

## 2020-09-15 ENCOUNTER — Telehealth: Payer: Self-pay | Admitting: Internal Medicine

## 2020-09-15 MED ORDER — AMPHETAMINE-DEXTROAMPHET ER 30 MG PO CP24
30.0000 mg | ORAL_CAPSULE | ORAL | 0 refills | Status: DC
Start: 2020-09-15 — End: 2020-11-01

## 2020-09-15 NOTE — Telephone Encounter (Signed)
Requesting: Adderall XR 30mg   Contract: 10/08/2018 UDS: 01/01/2020 Last Visit: 05/24/2020 Next Visit: None scheduled Last Refill: 08/08/2020 #30 and 0RF Pt sig: 1 capsule daily  Pt did not have labs completed from visit 05/2020- I have sent a message to Mitzo to schedule a lab appt for these.   Please Advise

## 2020-09-15 NOTE — Telephone Encounter (Signed)
Try to call patient to schedule lab appt, unable to leave message, answering machine is full.

## 2020-09-15 NOTE — Telephone Encounter (Signed)
Medication:  amphetamine-dextroamphetamine (ADDERALL XR) 30 MG 24 hr capsule   Has the patient contacted their pharmacy? No. (If no, request that the patient contact the pharmacy for the refill.) (If yes, when and what did the pharmacy advise?)  Preferred Pharmacy (with phone number or street name):  [id number] - [place], [place] - [address] AT [facility] [address]  [address], [place] [address]  Phone:  [phone] Fax:  [phone]  DEA #:  [id number] Agent: Please be advised that RX refills may take up to 3 business days. We ask that you follow-up with your pharmacy.  

## 2020-09-15 NOTE — Telephone Encounter (Addendum)
I will send request to PCP but Pt never returned for labs requested at 05/2020 visit. Please schedule at his earliest convenience, these still need to be done and will need to be done before 10:30am as a testosterone level is being checked.Thank you.

## 2020-09-15 NOTE — Telephone Encounter (Signed)
PDMP okay, prescription sent 

## 2020-10-20 ENCOUNTER — Ambulatory Visit: Payer: No Typology Code available for payment source | Admitting: Endocrinology

## 2020-10-27 ENCOUNTER — Telehealth: Payer: Self-pay | Admitting: Internal Medicine

## 2020-10-27 NOTE — Telephone Encounter (Signed)
Medication: amphetamine-dextroamphetamine (ADDERALL XR) 30 MG 24 hr capsule    Has the patient contacted their pharmacy? No. (If no, request that the patient contact the pharmacy for the refill.) (If yes, when and what did the pharmacy advise?)  Preferred Pharmacy (with phone number or street name):  [id number] - [place], [place] - [address] AT [facility] [address] Phone:  [phone]  Fax:  [phone]       Agent: Please be advised that RX refills may take up to 3 business days. We ask that you follow-up with your pharmacy.

## 2020-10-27 NOTE — Telephone Encounter (Signed)
Denied, overdue for appt. If he can do a virtual visit today we have openings.

## 2020-10-31 NOTE — Telephone Encounter (Signed)
Scheduled appointment 11/01/2020 virtually.

## 2020-11-01 ENCOUNTER — Encounter: Payer: Self-pay | Admitting: Internal Medicine

## 2020-11-01 ENCOUNTER — Other Ambulatory Visit: Payer: Self-pay

## 2020-11-01 ENCOUNTER — Telehealth (INDEPENDENT_AMBULATORY_CARE_PROVIDER_SITE_OTHER): Payer: No Typology Code available for payment source | Admitting: Internal Medicine

## 2020-11-01 VITALS — HR 93 | Temp 97.8°F | Resp 18 | Ht 66.0 in

## 2020-11-01 DIAGNOSIS — F988 Other specified behavioral and emotional disorders with onset usually occurring in childhood and adolescence: Secondary | ICD-10-CM | POA: Diagnosis not present

## 2020-11-01 DIAGNOSIS — U071 COVID-19: Secondary | ICD-10-CM

## 2020-11-01 MED ORDER — AMPHETAMINE-DEXTROAMPHET ER 30 MG PO CP24
30.0000 mg | ORAL_CAPSULE | ORAL | 0 refills | Status: DC
Start: 2020-11-01 — End: 2020-12-06

## 2020-11-01 NOTE — Progress Notes (Signed)
Subjective:    Patient ID: Kyle Dunn, male    DOB: 07/02/1971, 49 y.o.   MRN: [id number]  DOS:  11/01/2020 Type of visit - description: Virtual Visit via Video Note  I connected with the above patient  by a video enabled telemedicine application and verified that I am speaking with the correct person using two identifiers.   THIS ENCOUNTER IS A VIRTUAL VISIT DUE TO COVID-19 - PATIENT WAS NOT SEEN IN THE OFFICE. PATIENT HAS CONSENTED TO VIRTUAL VISIT / TELEMEDICINE VISIT   Location of patient: home  Location of provider: office  Persons participating in the virtual visit: patient, provider   I discussed the limitations of evaluation and management by telemedicine and the availability of in person appointments. The patient expressed understanding and agreed to proceed.  Acute The patient flew to [place] approximately 10/24/2020, he becomes sick on 10/28/2020: Cough, mild chest congestion. He tested positive for Covid today.  Currently feeling okay. Had mild increase in temperature yesterday: 100 degrees. No chest pain no difficulty breathing. No GI symptoms    Review of Systems See above   Past Medical History:  Diagnosis Date  . ADD (attention deficit disorder)   . Herniated disc, cervical    x2  . Pain, neck 2019   w/ L radiculopathy, better w/ conservative RX    Past Surgical History:  Procedure Laterality Date  . KNEE ARTHROSCOPY     R at age 19    Allergies as of 11/01/2020   No Known Allergies     Medication List       Accurate as of November 01, 2020 11:59 PM. If you have any questions, ask your nurse or doctor.        amphetamine-dextroamphetamine 30 MG 24 hr capsule Commonly known as: ADDERALL XR Take 1 capsule (30 mg total) by mouth every morning.   ascorbic acid 1000 MG tablet Commonly known as: VITAMIN C Take 1,000 mg by mouth daily.   Calcium 200 MG Tabs Take 1 tablet by mouth daily.   calcium-vitamin D 500-200 MG-UNIT  tablet Commonly known as: OSCAL WITH D Take 1 tablet by mouth daily.   IODINE STRONG PO Take 1 tablet by mouth daily.   L-FORMULA LYSINE HCL PO Take 1 tablet by mouth daily.   multivitamin with minerals Tabs tablet Take 1 tablet by mouth daily.   NON FORMULARY Bio-actine silver hydrosol   OVER THE COUNTER MEDICATION Blubonnet maxi one   PROBIOTIC-10 PO Take 1 tablet by mouth daily.   RED YEAST RICE PO Take by mouth.          Objective:   Physical Exam Pulse 93   Temp 97.8 F (36.6 C) (Oral)   Resp 18   Ht 5\' 6"  (1.676 m)   SpO2 95%   BMI 28.93 kg/m  This is a virtual video visit, he is alert oriented x3, in no distress.  No vital signs available.    Assessment     Assessment ADD Hyperlipidemia  PLAN   COVID-19: Symptoms a started 10/28/2020, tested positive today. Symptoms are mild cough, mild chest congestion, T-max yesterday was 100. He had 2 Covid vaccinations. Currently he does not qualify for monoclonal antibody infusion. I anticipate he will do well but I recommend to monitor his symptoms closely. Plan: Quarantine for 10 days, Mucinex DM, rest, fluids.  Call or seek medical attention if severe symptoms, chest pain, difficulty breathing.  He verbalized understanding. ADD: Request a refill.  Sent.  Labs: Due for labs from last visit, encouraged to proceed once he is better      I discussed the assessment and treatment plan with the patient. The patient was provided an opportunity to ask questions and all were answered. The patient agreed with the plan and demonstrated an understanding of the instructions.   The patient was advised to call back or seek an in-person evaluation if the symptoms worsen or if the condition fails to improve as anticipated.

## 2020-11-01 NOTE — Progress Notes (Signed)
Pre visit review using our clinic review tool, if applicable. No additional management support is needed unless otherwise documented below in the visit note. 

## 2020-11-02 NOTE — Assessment & Plan Note (Signed)
COVID-19: Symptoms a started 10/28/2020, tested positive today. Symptoms are mild cough, mild chest congestion, T-max yesterday was 100. He had 2 Covid vaccinations. Currently he does not qualify for monoclonal antibody infusion. I anticipate he will do well but I recommend to monitor his symptoms closely. Plan: Quarantine for 10 days, Mucinex DM, rest, fluids.  Call or seek medical attention if severe symptoms, chest pain, difficulty breathing.  He verbalized understanding. ADD: Request a refill.  Sent. Labs: Due for labs from last visit, encouraged to proceed once he is better

## 2020-12-06 ENCOUNTER — Telehealth: Payer: Self-pay | Admitting: Internal Medicine

## 2020-12-06 ENCOUNTER — Other Ambulatory Visit: Payer: Self-pay

## 2020-12-06 MED ORDER — AMPHETAMINE-DEXTROAMPHET ER 30 MG PO CP24
30.0000 mg | ORAL_CAPSULE | ORAL | 0 refills | Status: DC
Start: 2020-12-06 — End: 2020-12-06

## 2020-12-06 MED ORDER — AMPHETAMINE-DEXTROAMPHET ER 30 MG PO CP24
30.0000 mg | ORAL_CAPSULE | ORAL | 0 refills | Status: DC
Start: 2020-12-06 — End: 2021-01-06

## 2020-12-06 NOTE — Progress Notes (Signed)
PDMP okay, Rx sent x2 

## 2020-12-06 NOTE — Progress Notes (Signed)
Requesting: Adderall XR Contract: 10/08/18 UDS: 01/01/20 Last Visit: 11/01/20 Next Visit: none Last Refill: 11/01/20   Please Advise

## 2020-12-06 NOTE — Telephone Encounter (Signed)
Medication: amphetamine-dextroamphetamine (ADDERALL XR) 30 MG 24 hr capsule [[id number]]       Has the patient contacted their pharmacy?  (If no, request that the patient contact the pharmacy for the refill.) (If yes, when and what did the pharmacy advise?)     Preferred Pharmacy (with phone number or street name):  [id number] - [place], [place] - [address] AT [facility] [address]  [address], [place] [address]  Phone:  [phone] Fax:  [phone]    Agent: Please be advised that RX refills may take up to 3 business days. We ask that you follow-up with your pharmacy.

## 2021-01-06 ENCOUNTER — Telehealth: Payer: Self-pay | Admitting: Internal Medicine

## 2021-01-06 MED ORDER — AMPHETAMINE-DEXTROAMPHET ER 30 MG PO CP24
30.0000 mg | ORAL_CAPSULE | ORAL | 0 refills | Status: DC
Start: 2021-01-06 — End: 2021-02-09

## 2021-01-06 NOTE — Telephone Encounter (Signed)
Requesting: Adderall XR 30mg  Contract: 10/08/2018, due now UDS: Due now Last Visit: 11/01/2020 Next Visit: None Last Refill: 12/06/20 #30 and 0RF  Please Advise

## 2021-01-06 NOTE — Telephone Encounter (Signed)
PDMP okay, Rx sent 

## 2021-01-06 NOTE — Telephone Encounter (Signed)
Medication:  amphetamine-dextroamphetamine (ADDERALL XR) 30 MG 24 hr capsule   Has the patient contacted their pharmacy? No. (If no, request that the patient contact the pharmacy for the refill.) (If yes, when and what did the pharmacy advise?)  Preferred Pharmacy (with phone number or street name):  [id number] - [place], [place] - [address] AT [facility] [address]  [address], [place] [address]  Phone:  [phone] Fax:  [phone]  DEA #:  [id number] Agent: Please be advised that RX refills may take up to 3 business days. We ask that you follow-up with your pharmacy.

## 2021-01-30 ENCOUNTER — Encounter: Payer: Self-pay | Admitting: Internal Medicine

## 2021-02-09 ENCOUNTER — Telehealth: Payer: Self-pay | Admitting: Internal Medicine

## 2021-02-09 MED ORDER — AMPHETAMINE-DEXTROAMPHET ER 30 MG PO CP24
30.0000 mg | ORAL_CAPSULE | ORAL | 0 refills | Status: DC
Start: 2021-02-09 — End: 2021-03-20

## 2021-02-09 NOTE — Telephone Encounter (Signed)
Medication:  amphetamine-dextroamphetamine (ADDERALL XR) 30 MG 24 hr capsule [[id number]]     Has the patient contacted their pharmacy?  (If no, request that the patient contact the pharmacy for the refill.) (If yes, when and what did the pharmacy advise?)     Preferred Pharmacy (with phone number or street name): [id number] - [place], [place] - [address] AT [facility] [address]  [address], [place] [address]  Phone:  [phone] Fax:  [phone]  DEA #:  [id number]     Agent: Please be advised that RX refills may take up to 3 business days. We ask that you follow-up with your pharmacy.

## 2021-02-09 NOTE — Telephone Encounter (Signed)
Pt has still not returned for labs ordered back in 05/2020. I mailed him a letter on 01/30/21. Would you like to refill or deny?

## 2021-02-09 NOTE — Telephone Encounter (Signed)
Done

## 2021-02-09 NOTE — Telephone Encounter (Signed)
PDMP okay, Rx sent. Sherri, please call the patient, schedule a CPX

## 2021-02-09 NOTE — Telephone Encounter (Signed)
Requesting: Adderall XR 30mg  Contract: 10/08/2018 UDS: 01/01/2020 Last Visit: 11/01/2020 Next Visit: None Last Refill: 01/06/2021 #30 and 0RF  Please Advise

## 2021-02-27 ENCOUNTER — Encounter: Payer: Self-pay | Admitting: Internal Medicine

## 2021-02-27 ENCOUNTER — Encounter: Payer: No Typology Code available for payment source | Admitting: Internal Medicine

## 2021-03-20 ENCOUNTER — Telehealth: Payer: Self-pay | Admitting: Internal Medicine

## 2021-03-20 MED ORDER — AMPHETAMINE-DEXTROAMPHET ER 30 MG PO CP24
30.0000 mg | ORAL_CAPSULE | ORAL | 0 refills | Status: DC
Start: 2021-03-20 — End: 2021-04-27

## 2021-03-20 NOTE — Telephone Encounter (Signed)
Pt missed his CPE appt and has rescheduled for 04/11/21. Pt states he is complete out of his Aderall and wants to know if there is anyway he can get the rx just enough to get him through until his appt or if he can be scheduled sooner. Pt would like a call back.

## 2021-03-20 NOTE — Telephone Encounter (Signed)
Spoke w/ Pt- informed Rx sent by Dr. Patsy Lager. Pt verbalized understanding.

## 2021-03-20 NOTE — Telephone Encounter (Signed)
Please advise- will you refill in Kyle Dunn's absence. He no showed his last appt. Rescheduled for 04/11/2021.

## 2021-04-11 ENCOUNTER — Encounter: Payer: No Typology Code available for payment source | Admitting: Internal Medicine

## 2021-04-11 ENCOUNTER — Other Ambulatory Visit: Payer: Self-pay

## 2021-04-11 ENCOUNTER — Encounter: Payer: Self-pay | Admitting: Internal Medicine

## 2021-04-11 ENCOUNTER — Ambulatory Visit (INDEPENDENT_AMBULATORY_CARE_PROVIDER_SITE_OTHER): Payer: No Typology Code available for payment source | Admitting: Internal Medicine

## 2021-04-11 VITALS — BP 142/98 | HR 98 | Temp 98.0°F | Resp 16 | Ht 66.0 in | Wt 177.0 lb

## 2021-04-11 DIAGNOSIS — E785 Hyperlipidemia, unspecified: Secondary | ICD-10-CM | POA: Diagnosis not present

## 2021-04-11 DIAGNOSIS — F988 Other specified behavioral and emotional disorders with onset usually occurring in childhood and adolescence: Secondary | ICD-10-CM

## 2021-04-11 DIAGNOSIS — Z1211 Encounter for screening for malignant neoplasm of colon: Secondary | ICD-10-CM

## 2021-04-11 DIAGNOSIS — Z0000 Encounter for general adult medical examination without abnormal findings: Secondary | ICD-10-CM

## 2021-04-11 DIAGNOSIS — Z79899 Other long term (current) drug therapy: Secondary | ICD-10-CM | POA: Diagnosis not present

## 2021-04-11 DIAGNOSIS — Z1159 Encounter for screening for other viral diseases: Secondary | ICD-10-CM

## 2021-04-11 NOTE — Patient Instructions (Signed)
Check the  blood pressure weekly x 4 then monthly BP GOAL is between 110/65 and  135/85. If it is consistently higher or lower, let me know  Call gastroenterology and set up a colonoscopy. [phone]  GO TO THE LAB : Get the blood work     GO TO THE FRONT DESK, PLEASE SCHEDULE YOUR APPOINTMENTS Come back for a check up in 6-8 months

## 2021-04-11 NOTE — Progress Notes (Signed)
Subjective:    Patient ID: Kyle Dunn, male    DOB: 1971/02/12, 50 y.o.   MRN: [id number]  DOS:  04/11/2021 Type of visit - description: CPX  Since the last office visit he is doing well. Admits to some stress, he now owns his own business.  BP Readings from Last 3 Encounters:  04/11/21 (!) 142/98  05/24/20 120/76  10/15/19 130/70     Review of Systems  Other than above, a 14 point review of systems is negative       Past Medical History:  Diagnosis Date   ADD (attention deficit disorder)    Herniated disc, cervical    x2   Pain, neck 2019   w/ L radiculopathy, better w/ conservative RX    Past Surgical History:  Procedure Laterality Date   KNEE ARTHROSCOPY     R at age 24   Social History   Socioeconomic History   Marital status: Married    Spouse name: Not on file   Number of children: 2   Years of education: Not on file   Highest education level: Not on file  Occupational History   Occupation: Ecologist: OTHER   Occupation: owns a Marine scientist  Tobacco Use   Smoking status: Never   Smokeless tobacco: Never  Substance and Sexual Activity   Alcohol use: Yes    Alcohol/week: 0.0 standard drinks    Comment: socially    Drug use: No   Sexual activity: Not on file  Other Topics Concern   Not on file  Social History Narrative   re-married    2 children (1993 45)   Social Determinants of Health   Financial Resource Strain: Not on file  Food Insecurity: Not on file  Transportation Needs: Not on file  Physical Activity: Not on file  Stress: Not on file  Social Connections: Not on file  Intimate Partner Violence: Not on file    Allergies as of 04/11/2021   No Known Allergies      Medication List        Accurate as of April 11, 2021 11:59 PM. If you have any questions, ask your nurse or doctor.          STOP taking these medications    Calcium 200 MG Tabs Stopped by: Willow Ora, MD   IODINE STRONG PO Stopped by:  Willow Ora, MD   L-FORMULA LYSINE HCL PO Stopped by: Willow Ora, MD   NON FORMULARY Stopped by: Willow Ora, MD   OVER THE COUNTER MEDICATION Stopped by: Willow Ora, MD       TAKE these medications    amphetamine-dextroamphetamine 30 MG 24 hr capsule Commonly known as: ADDERALL XR Take 1 capsule (30 mg total) by mouth every morning.   ascorbic acid 1000 MG tablet Commonly known as: VITAMIN C Take 1,000 mg by mouth daily.   calcium-vitamin D 500-200 MG-UNIT tablet Commonly known as: OSCAL WITH D Take 1 tablet by mouth daily.   multivitamin with minerals Tabs tablet Take 1 tablet by mouth daily.   PROBIOTIC-10 PO Take 1 tablet by mouth daily.   RED YEAST RICE PO Take by mouth.           Objective:   Physical Exam BP (!) 142/98 (BP Location: Left Arm, Patient Position: Sitting, Cuff Size: Small)   Pulse 98   Temp 98 F (36.7 C) (Oral)   Resp 16   Ht 5\' 6"  (1.676 m)  Wt 177 lb (80.3 kg)   SpO2 97%   BMI 28.57 kg/m  General: Well developed, NAD, BMI noted Neck: No  thyromegaly  HEENT:  Normocephalic . Face symmetric, atraumatic Lungs:  CTA B Normal respiratory effort, no intercostal retractions, no accessory muscle use. Heart: RRR,  no murmur.  Abdomen:  Not distended, soft, non-tender. No rebound or rigidity.   Lower extremities: no pretibial edema bilaterally  Skin: Exposed areas without rash. Not pale. Not jaundice DRE: Normal sphincter tone, brown stool, prostate perhaps a slight increase in size but not nodular or tender Neurologic:  alert & oriented X3.  Speech normal, gait appropriate for age and unassisted Strength symmetric and appropriate for age.  Psych: Cognition and judgment appear intact.  Cooperative with normal attention span and concentration.  Behavior appropriate. No anxious or depressed appearing.     Assessment    Assessment ADD Hyperlipidemia COVID infection 10/2020   PLAN   Here for CPX ADD: Well-controlled, RF  Adderall as needed, contract and UDS Elevated BP: BP today is a slightly elevated, typically okay, he just got a phone call from work with some issues.  Rec to check at home, see AVS. Hyperlipidemia: Diet controlled, check labs. RTC in about a months   This visit occurred during the SARS-CoV-2 public health emergency.  Safety protocols were in place, including screening questions prior to the visit, additional usage of staff PPE, and extensive cleaning of exam room while observing appropriate contact time as indicated for disinfecting solutions.

## 2021-04-12 LAB — CBC WITH DIFFERENTIAL/PLATELET
Basophils Absolute: 0 10*3/uL (ref 0.0–0.1)
Basophils Relative: 0.9 % (ref 0.0–3.0)
Eosinophils Absolute: 0.1 10*3/uL (ref 0.0–0.7)
Eosinophils Relative: 1 % (ref 0.0–5.0)
HCT: 46.1 % (ref 39.0–52.0)
Hemoglobin: 15.7 g/dL (ref 13.0–17.0)
Lymphocytes Relative: 29.4 % (ref 12.0–46.0)
Lymphs Abs: 1.6 10*3/uL (ref 0.7–4.0)
MCHC: 34 g/dL (ref 30.0–36.0)
MCV: 85.3 fl (ref 78.0–100.0)
Monocytes Absolute: 0.6 10*3/uL (ref 0.1–1.0)
Monocytes Relative: 12 % (ref 3.0–12.0)
Neutro Abs: 3 10*3/uL (ref 1.4–7.7)
Neutrophils Relative %: 56.7 % (ref 43.0–77.0)
Platelets: 229 10*3/uL (ref 150.0–400.0)
RBC: 5.41 Mil/uL (ref 4.22–5.81)
RDW: 13 % (ref 11.5–15.5)
WBC: 5.3 10*3/uL (ref 4.0–10.5)

## 2021-04-12 LAB — HEPATITIS C ANTIBODY
Hepatitis C Ab: NONREACTIVE
SIGNAL TO CUT-OFF: 0 (ref <1.00)

## 2021-04-12 LAB — COMPREHENSIVE METABOLIC PANEL
ALT: 20 U/L (ref 0–53)
AST: 22 U/L (ref 0–37)
Albumin: 4.8 g/dL (ref 3.5–5.2)
Alkaline Phosphatase: 52 U/L (ref 39–117)
BUN: 19 mg/dL (ref 6–23)
CO2: 25 mEq/L (ref 19–32)
Calcium: 9.6 mg/dL (ref 8.4–10.5)
Chloride: 99 mEq/L (ref 96–112)
Creatinine, Ser: 0.93 mg/dL (ref 0.40–1.50)
GFR: 95.39 mL/min (ref >60.00)
Glucose, Bld: 79 mg/dL (ref 70–99)
Potassium: 4.1 mEq/L (ref 3.5–5.1)
Sodium: 137 mEq/L (ref 135–145)
Total Bilirubin: 0.9 mg/dL (ref 0.2–1.2)
Total Protein: 7.3 g/dL (ref 6.0–8.3)

## 2021-04-12 LAB — LIPID PANEL
Cholesterol: 285 mg/dL — ABNORMAL HIGH (ref 0–200)
HDL: 62.2 mg/dL (ref >39.00)
LDL Cholesterol: 198 mg/dL — ABNORMAL HIGH (ref 0–99)
NonHDL: 222.76
Total CHOL/HDL Ratio: 5
Triglycerides: 122 mg/dL (ref 0.0–149.0)
VLDL: 24.4 mg/dL (ref 0.0–40.0)

## 2021-04-12 LAB — PSA: PSA: 0.49 ng/mL (ref 0.10–4.00)

## 2021-04-12 LAB — TSH: TSH: 0.85 u[IU]/mL (ref 0.35–4.50)

## 2021-04-13 ENCOUNTER — Encounter: Payer: Self-pay | Admitting: Internal Medicine

## 2021-04-13 NOTE — Assessment & Plan Note (Signed)
Here for CPX ADD: Well-controlled, RF Adderall as needed, contract and UDS Elevated BP: BP today is a slightly elevated, typically okay, he just got a phone call from work with some issues.  Rec to check at home, see AVS. Hyperlipidemia: Diet controlled, check labs. RTC in about a months

## 2021-04-13 NOTE — Assessment & Plan Note (Signed)
-  Td 2014 - shingrix : declined  - COVID VAX x2, booster recommended -FUX:NATFT had a cscope , father had colon ca dx  in his 51s, no other family members affected, no FH of colon polyps .  Refer to GI. -prostate ca screening: No family history, DRE normal, no symptoms, check PSA -He has a very healthy lifestyle. -Labs:  CMP, FLP, CBC, TSH, PSA, hep C

## 2021-04-14 LAB — DRUG MONITORING, PANEL 8 WITH CONFIRMATION, URINE
6 Acetylmorphine: NEGATIVE ng/mL (ref <10)
Alcohol Metabolites: NEGATIVE ng/mL
Amphetamine: >15000 ng/mL — ABNORMAL HIGH (ref <250)
Amphetamines: POSITIVE ng/mL — AB (ref <500)
Benzodiazepines: NEGATIVE ng/mL (ref <100)
Buprenorphine, Urine: NEGATIVE ng/mL (ref <5)
Cocaine Metabolite: NEGATIVE ng/mL (ref <150)
Creatinine: 133.3 mg/dL
MDMA: NEGATIVE ng/mL (ref <500)
Marijuana Metabolite: NEGATIVE ng/mL (ref <20)
Methamphetamine: NEGATIVE ng/mL (ref <250)
Opiates: NEGATIVE ng/mL (ref <100)
Oxidant: NEGATIVE ug/mL
Oxycodone: NEGATIVE ng/mL (ref <100)
pH: 5.5 (ref 4.5–9.0)

## 2021-04-14 LAB — DM TEMPLATE

## 2021-04-27 ENCOUNTER — Telehealth: Payer: Self-pay

## 2021-04-27 MED ORDER — AMPHETAMINE-DEXTROAMPHET ER 30 MG PO CP24: 30.0000 mg | ORAL_CAPSULE | ORAL | 0 refills | Status: DC

## 2021-04-27 NOTE — Telephone Encounter (Signed)
Patient called requesting refill on medication. 

## 2021-04-27 NOTE — Telephone Encounter (Signed)
Requesting: Adderall XR 30mg  Contract: 04/11/2021 UDS: 04/11/2021 Last Visit: 04/11/2021 Next Visit: None Last Refill: 03/20/2021 #30 and 0RF  Please Advise

## 2021-04-27 NOTE — Telephone Encounter (Signed)
Prescription sent x2, PDMP okay 

## 2021-06-01 ENCOUNTER — Telehealth: Payer: Self-pay | Admitting: Internal Medicine

## 2021-06-01 MED ORDER — AMPHETAMINE-DEXTROAMPHET ER 30 MG PO CP24: 30.0000 mg | ORAL_CAPSULE | ORAL | 0 refills | Status: DC

## 2021-06-01 NOTE — Telephone Encounter (Signed)
Medication: amphetamine-dextroamphetamine (ADDERALL XR) 30 MG 24 hr capsule [[id number]]      Has the patient contacted their pharmacy? No. (If no, request that the patient contact the pharmacy for the refill.) (If yes, when and what did the pharmacy advise?)     Preferred Pharmacy (with phone number or street name): [id number] - [place], [place] - [address] AT [facility] [address]  [address], [place] [address]  Phone:  [phone]  Fax:  [phone]  DEA #:  [id number]     Agent: Please be advised that RX refills may take up to 3 business days. We ask that you follow-up with your pharmacy.

## 2021-06-01 NOTE — Telephone Encounter (Signed)
Requesting: Adderall XR 30mg  Contract: 04/11/2021 UDS: 04/11/2021 Last Visit: 04/11/2021 Next Visit: None Last Refill: 04/27/2021 #30 and 0RF  Please Advise

## 2021-06-01 NOTE — Telephone Encounter (Signed)
PDMP okay, Rx sent 

## 2021-07-05 ENCOUNTER — Telehealth: Payer: Self-pay | Admitting: Internal Medicine

## 2021-07-05 MED ORDER — AMPHETAMINE-DEXTROAMPHET ER 30 MG PO CP24: 30.0000 mg | ORAL_CAPSULE | ORAL | 0 refills | Status: DC

## 2021-07-05 NOTE — Telephone Encounter (Signed)
PDMP okay, Rx sent x2 

## 2021-07-05 NOTE — Telephone Encounter (Signed)
Medication: amphetamine-dextroamphetamine (ADDERALL XR) 30 MG 24 hr capsule  ° °Has the patient contacted their pharmacy? No. °(If no, request that the patient contact the pharmacy for the refill.) °(If yes, when and what did the pharmacy advise?) ° °Preferred Pharmacy (with phone number or street name):  °[id number] - [place], [place] - [address] AT [facility] [address]  °[address], [place] [address]  °Phone:  [phone]  Fax:  [phone]  ° °Agent: Please be advised that RX refills may take up to 3 business days. We ask that you follow-up with your pharmacy.  °

## 2021-07-05 NOTE — Telephone Encounter (Signed)
Requesting: Adderall XR 30mg  Contract: 04/11/2021 UDS: 04/11/2021 Last Visit: 04/11/2021 Next Visit: None Last Refill: 06/01/2021 #30 and 0RF  Please Advise

## 2021-08-10 ENCOUNTER — Telehealth: Payer: Self-pay | Admitting: Internal Medicine

## 2021-08-10 MED ORDER — AMPHETAMINE-DEXTROAMPHET ER 30 MG PO CP24: 30.0000 mg | ORAL_CAPSULE | ORAL | 0 refills | Status: DC

## 2021-08-10 NOTE — Telephone Encounter (Signed)
Medication: amphetamine-dextroamphetamine (ADDERALL XR) 30 MG 24 hr capsule  ° °Has the patient contacted their pharmacy? No. °(If no, request that the patient contact the pharmacy for the refill.) °(If yes, when and what did the pharmacy advise?) ° °Preferred Pharmacy (with phone number or street name):  °[id number] - [place], [place] - [address] AT [facility] [address]  °[address], [place] [address]  °Phone:  [phone]  Fax:  [phone]  ° °Agent: Please be advised that RX refills may take up to 3 business days. We ask that you follow-up with your pharmacy.  °

## 2021-08-10 NOTE — Telephone Encounter (Signed)
PDMP okay, Rx sent x2 

## 2021-08-10 NOTE — Telephone Encounter (Signed)
Requesting: Adderall XR 30mg  Contract: 04/11/2021 UDS: 04/11/2021 Last Visit: 04/11/2021 Next Visit: None Last Refill: 07/05/2021 #30 and 0RF  Please Advise

## 2021-09-19 ENCOUNTER — Telehealth: Payer: Self-pay | Admitting: Internal Medicine

## 2021-09-19 NOTE — Telephone Encounter (Signed)
Medication: amphetamine-dextroamphetamine (ADDERALL XR) 30 MG 24 hr capsule   Has the patient contacted their pharmacy? Yes.   (If no, request that the patient contact the pharmacy for the refill.) (If yes, when and what did the pharmacy advise?)  Preferred Pharmacy (with phone number or street name):  [facility] #[id number] - [place], [place] - [address] AT [facility] [address]  [address], [place] [address]   Agent: Please be advised that RX refills may take up to 3 business days. We ask that you follow-up with your pharmacy.

## 2021-09-20 MED ORDER — AMPHETAMINE-DEXTROAMPHET ER 30 MG PO CP24: 30.0000 mg | ORAL_CAPSULE | ORAL | 0 refills | Status: DC

## 2021-09-20 NOTE — Telephone Encounter (Signed)
Requesting:Adderall XR 30mg  Contract: 04/11/2021 UDS: 04/11/2021 Last Visit: 04/11/2021 Next Visit: None Last Refill: 08/10/2021 #30 and 0RF  Please Advise

## 2021-09-20 NOTE — Telephone Encounter (Signed)
PDMP okay, Rx sent x2 

## 2021-10-20 ENCOUNTER — Telehealth: Payer: Self-pay | Admitting: Internal Medicine

## 2021-10-20 NOTE — Telephone Encounter (Signed)
Rx already on file for December 2022. Spoke w/ Pt- informed Rx should be at [facility]- he is to let us know if they are unable to fill it.

## 2021-10-20 NOTE — Telephone Encounter (Signed)
Medication: amphetamine-dextroamphetamine (ADDERALL XR) 30 MG 24 hr capsule   Has the patient contacted their pharmacy? No. (If no, request that the patient contact the pharmacy for the refill.) (If yes, when and what did the pharmacy advise?)  Preferred Pharmacy (with phone number or street name):  [facility] #[id number] - [place], [place] - [address] AT [facility] [address]  [address], [place] [address]  Phone:  [phone]  Fax:  [phone]   Agent: Please be advised that RX refills may take up to 3 business days. We ask that you follow-up with your pharmacy.

## 2021-12-19 ENCOUNTER — Telehealth: Payer: Self-pay | Admitting: Internal Medicine

## 2021-12-19 MED ORDER — AMPHETAMINE-DEXTROAMPHET ER 30 MG PO CP24: 30.0000 mg | ORAL_CAPSULE | ORAL | 0 refills | Status: DC

## 2021-12-19 NOTE — Telephone Encounter (Signed)
Requesting: Adderall XR 30mg   Contract: 04/11/2021 UDS: 04/11/2021 Last Visit: 04/11/2021 Next Visit: None Last Refill: 09/20/2021 #30 and 0RF (x2)  Please Advise

## 2021-12-19 NOTE — Telephone Encounter (Signed)
Medication: amphetamine-dextroamphetamine (ADDERALL XR) 30 MG 24 hr capsule  Has the patient contacted their pharmacy? Yes.   (If no, request that the patient contact the pharmacy for the refill.) (If yes, when and what did the pharmacy advise?)  Preferred Pharmacy (with phone number or street name):  [facility] #[id number] - [place], [place] - [address] AT [facility] [address]  [address], [place] [address]  Phone:  [phone]  Fax:  [phone]   Agent: Please be advised that RX refills may take up to 3 business days. We ask that you follow-up with your pharmacy.

## 2021-12-19 NOTE — Telephone Encounter (Signed)
PDMP okay, Rx sent 

## 2022-01-04 ENCOUNTER — Encounter: Payer: Self-pay | Admitting: Internal Medicine

## 2022-01-29 ENCOUNTER — Ambulatory Visit: Payer: No Typology Code available for payment source | Admitting: Internal Medicine

## 2022-05-18 ENCOUNTER — Encounter: Payer: Self-pay | Admitting: Internal Medicine

## 2022-05-18 ENCOUNTER — Telehealth (HOSPITAL_BASED_OUTPATIENT_CLINIC_OR_DEPARTMENT_OTHER): Payer: Self-pay

## 2022-05-18 ENCOUNTER — Ambulatory Visit (INDEPENDENT_AMBULATORY_CARE_PROVIDER_SITE_OTHER): Payer: No Typology Code available for payment source | Admitting: Internal Medicine

## 2022-05-18 VITALS — BP 128/70 | HR 80 | Temp 98.1°F | Resp 16 | Ht 66.0 in | Wt 182.2 lb

## 2022-05-18 DIAGNOSIS — Z1211 Encounter for screening for malignant neoplasm of colon: Secondary | ICD-10-CM

## 2022-05-18 DIAGNOSIS — Z0000 Encounter for general adult medical examination without abnormal findings: Secondary | ICD-10-CM | POA: Diagnosis not present

## 2022-05-18 DIAGNOSIS — F988 Other specified behavioral and emotional disorders with onset usually occurring in childhood and adolescence: Secondary | ICD-10-CM | POA: Diagnosis not present

## 2022-05-18 DIAGNOSIS — E785 Hyperlipidemia, unspecified: Secondary | ICD-10-CM | POA: Diagnosis not present

## 2022-05-18 LAB — CBC WITH DIFFERENTIAL/PLATELET
Basophils Absolute: 0 10*3/uL (ref 0.0–0.1)
Basophils Relative: 0.5 % (ref 0.0–3.0)
Eosinophils Absolute: 0.1 10*3/uL (ref 0.0–0.7)
Eosinophils Relative: 1.7 % (ref 0.0–5.0)
HCT: 47.9 % (ref 39.0–52.0)
Hemoglobin: 16.1 g/dL (ref 13.0–17.0)
Lymphocytes Relative: 29.5 % (ref 12.0–46.0)
Lymphs Abs: 1.7 10*3/uL (ref 0.7–4.0)
MCHC: 33.6 g/dL (ref 30.0–36.0)
MCV: 87.2 fl (ref 78.0–100.0)
Monocytes Absolute: 0.7 10*3/uL (ref 0.1–1.0)
Monocytes Relative: 12.8 % — ABNORMAL HIGH (ref 3.0–12.0)
Neutro Abs: 3.1 10*3/uL (ref 1.4–7.7)
Neutrophils Relative %: 55.5 % (ref 43.0–77.0)
Platelets: 225 10*3/uL (ref 150.0–400.0)
RBC: 5.49 Mil/uL (ref 4.22–5.81)
RDW: 13.4 % (ref 11.5–15.5)
WBC: 5.6 10*3/uL (ref 4.0–10.5)

## 2022-05-18 LAB — COMPREHENSIVE METABOLIC PANEL
ALT: 29 U/L (ref 0–53)
AST: 29 U/L (ref 0–37)
Albumin: 5 g/dL (ref 3.5–5.2)
Alkaline Phosphatase: 54 U/L (ref 39–117)
BUN: 24 mg/dL — ABNORMAL HIGH (ref 6–23)
CO2: 25 mEq/L (ref 19–32)
Calcium: 9.6 mg/dL (ref 8.4–10.5)
Chloride: 102 mEq/L (ref 96–112)
Creatinine, Ser: 1.06 mg/dL (ref 0.40–1.50)
GFR: 80.9 mL/min (ref >60.00)
Glucose, Bld: 84 mg/dL (ref 70–99)
Potassium: 4.4 mEq/L (ref 3.5–5.1)
Sodium: 138 mEq/L (ref 135–145)
Total Bilirubin: 0.8 mg/dL (ref 0.2–1.2)
Total Protein: 7.5 g/dL (ref 6.0–8.3)

## 2022-05-18 MED ORDER — AMPHETAMINE-DEXTROAMPHET ER 30 MG PO CP24
30.0000 mg | ORAL_CAPSULE | ORAL | 0 refills | Status: DC
Start: 2022-05-18 — End: 2022-06-27

## 2022-05-18 NOTE — Patient Instructions (Addendum)
We will order a calcium coronary score.  Continue with your healthy lifestyle    GO TO THE LAB : Get the blood work     GO TO THE FRONT DESK, PLEASE SCHEDULE YOUR APPOINTMENTS Come back for a checkup in 3 months

## 2022-05-18 NOTE — Progress Notes (Unsigned)
Subjective:    Patient ID: Kyle Dunn, male    DOB: 1971/04/06, 51 y.o.   MRN: [id number]  DOS:  05/18/2022 Type of visit - description: cpx  Here for CPX Since the last office visit is doing well. No symptoms. Admits to a lot of stress but no depression or anxiety present  Review of Systems See above   Past Medical History:  Diagnosis Date   ADD (attention deficit disorder)    Herniated disc, cervical    x2   Pain, neck 2019   w/ L radiculopathy, better w/ conservative RX    Past Surgical History:  Procedure Laterality Date   KNEE ARTHROSCOPY     R at age 42    Current Outpatient Medications  Medication Instructions   amphetamine-dextroamphetamine (ADDERALL XR) 30 MG 24 hr capsule 30 mg, Oral, BH-each morning   amphetamine-dextroamphetamine (ADDERALL XR) 30 MG 24 hr capsule 30 mg, Oral, BH-each morning   ascorbic acid (VITAMIN C) 1,000 mg, Oral, Daily   calcium-vitamin D (OSCAL WITH D) 500-200 MG-UNIT tablet 1 tablet, Oral, Daily   Multiple Vitamin (MULTIVITAMIN WITH MINERALS) TABS tablet 1 tablet, Oral, Daily   Probiotic Product (PROBIOTIC-10 PO) 1 tablet, Oral, Daily   Red Yeast Rice Extract (RED YEAST RICE PO) Oral       Objective:   Physical Exam BP 128/70   Pulse 80   Temp 98.1 F (36.7 C) (Oral)   Resp 16   Ht 5\' 6"  (1.676 m)   Wt 182 lb 4 oz (82.7 kg)   SpO2 97%   BMI 29.42 kg/m  General: Well developed, NAD, BMI noted Neck: No  thyromegaly  HEENT:  Normocephalic . Face symmetric, atraumatic Lungs:  CTA B Normal respiratory effort, no intercostal retractions, no accessory muscle use. Heart: RRR,  no murmur.  Abdomen:  Not distended, soft, non-tender. No rebound or rigidity.   Lower extremities: no pretibial edema bilaterally  Skin: Exposed areas without rash. Not pale. Not jaundice Neurologic:  alert & oriented X3.  Speech normal, gait appropriate for age and unassisted Strength symmetric and appropriate for age.   Psych: Cognition and judgment appear intact.  Cooperative with normal attention span and concentration.  Behavior appropriate. No anxious or depressed appearing.     Assessment    Assessment ADD Hyperlipidemia COVID infection 10/2020   PLAN   Here for CPX ADD: He held Adderall for a couple of months to give himself a break and see how he was doing.  He actually figured out that Adderall is really helpful.  RF sent. Stress: Work-related, we had a long conversation about the issue, fortunately he is currently needed well. Hyperlipidemia: LDL quite elevated, he has been reluctant to take cholesterol medication.  He has a very healthy lifestyle.  We agreed to check a calcium coronary score.  Further advised the results. RTC 3 months.   -Td 2014 - shingrix: d/w pt    - COVID VAX x2, booster recommended, declined at - flu shot q fall  -11/2020 had a cscope , referral to GI for the last year, will try again. -prostate ca screening: No family history, DRE and PSA normal last year. -Lifestyle: Still exercises daily, eating more vegetables. -Labs:  CMP, NMR, CBC   === ADD: Well-controlled, RF Adderall as needed, contract and UDS Elevated BP: BP today is a slightly elevated, typically okay, he just got a phone call from work with some issues.  Rec to check at home, see AVS. Hyperlipidemia:  Diet controlled, check labs. RTC in about a months

## 2022-05-19 ENCOUNTER — Encounter: Payer: Self-pay | Admitting: Internal Medicine

## 2022-05-19 LAB — NMR, LIPOPROFILE
Cholesterol, Total: 305 mg/dL — ABNORMAL HIGH (ref 100–199)
HDL Particle Number: 39.2 umol/L (ref >=30.5)
HDL-C: 68 mg/dL (ref >39)
LDL Particle Number: 2705 nmol/L — ABNORMAL HIGH (ref <1000)
LDL Size: 21.2 nm (ref >20.5)
LDL-C (NIH Calc): 218 mg/dL — ABNORMAL HIGH (ref 0–99)
LP-IR Score: 40 (ref <=45)
Small LDL Particle Number: 1127 nmol/L — ABNORMAL HIGH (ref <=527)
Triglycerides: 111 mg/dL (ref 0–149)

## 2022-05-19 NOTE — Assessment & Plan Note (Signed)
-  Td 2014 - shingrix: d/w pt    - COVID VAX x2, booster recommended, declined  - flu shot q fall  -CWC:[facility] had a cscope , failed previous  GI referral , will try again. -prostate ca screening: No family history, DRE and PSA normal last year. -Lifestyle: Still exercises daily, eating more vegetables. -Labs:  CMP, NMR, CBC

## 2022-05-19 NOTE — Assessment & Plan Note (Signed)
Here for CPX ADD: He held Adderall for a couple of months to give himself a break and see how he was doing.  He actually figured out that Adderall is really helpful.  RF sent. Stress: Work-related, we had a long conversation about the issue, fortunately he is currently doing well. Hyperlipidemia: LDL quite elevated, he has been reluctant to take cholesterol medication.  He has a very healthy lifestyle.  We agreed to check a calcium coronary score.  Further advised the results. RTC 3 months.

## 2022-06-19 ENCOUNTER — Telehealth (HOSPITAL_BASED_OUTPATIENT_CLINIC_OR_DEPARTMENT_OTHER): Payer: Self-pay

## 2022-06-27 ENCOUNTER — Other Ambulatory Visit: Payer: Self-pay | Admitting: Internal Medicine

## 2022-06-27 MED ORDER — AMPHETAMINE-DEXTROAMPHET ER 30 MG PO CP24: 30.0000 mg | ORAL_CAPSULE | ORAL | 0 refills | Status: DC

## 2022-06-27 NOTE — Telephone Encounter (Signed)
Medication: amphetamine-dextroamphetamine (ADDERALL XR) 30 MG 24 hr capsule   Has the patient contacted their pharmacy? Yes.    Preferred Pharmacy (with phone number or street name):   [id number] - [place], [place] - [address] AT [facility] OF [address]  [address], [place] [address]  Phone:  [phone]  Fax:  [phone]   Agent: Please be advised that RX refills may take up to 3 business days. We ask that you follow-up with your pharmacy.

## 2022-06-27 NOTE — Telephone Encounter (Signed)
Requesting: Adderall XR 30mg   Contract: 04/11/21 UDS: 04/11/21 Last Visit: 05/18/22 Next Visit: 08/22/22 Last Refill: 05/18/22 #30 and 0RF  Please Advise

## 2022-08-09 ENCOUNTER — Telehealth: Payer: Self-pay | Admitting: Internal Medicine

## 2022-08-09 MED ORDER — AMPHETAMINE-DEXTROAMPHET ER 30 MG PO CP24
30.0000 mg | ORAL_CAPSULE | ORAL | 0 refills | Status: DC
Start: 2022-08-09 — End: 2023-01-22

## 2022-08-09 MED ORDER — AMPHETAMINE-DEXTROAMPHET ER 30 MG PO CP24: 30.0000 mg | ORAL_CAPSULE | ORAL | 0 refills | Status: DC

## 2022-08-09 NOTE — Telephone Encounter (Signed)
Requesting: Adderall XR 30mg   Contract:04/11/21 UDS:04/11/21 Last Visit: 05/18/22 Next Visit:08/22/22 Last Refill:06/27/22 #30 and 0RF   Please Advise

## 2022-08-09 NOTE — Telephone Encounter (Signed)
Medication:   amphetamine-dextroamphetamine (ADDERALL XR) 30 MG 24 hr capsule [[id number]]   Has the patient contacted their pharmacy? No. (If no, request that the patient contact the pharmacy for the refill.) (If yes, when and what did the pharmacy advise?)  Preferred Pharmacy (with phone number or street name):   [facility], [place], [place] [address] Phone: [phone]  Fax: [phone]   Agent: Please be advised that RX refills may take up to 3 business days. We ask that you follow-up with your pharmacy.

## 2022-08-09 NOTE — Telephone Encounter (Signed)
PDMP okay, Rx sent x2 

## 2022-08-22 ENCOUNTER — Ambulatory Visit: Payer: No Typology Code available for payment source | Admitting: Internal Medicine

## 2022-09-20 ENCOUNTER — Telehealth: Payer: Self-pay | Admitting: Internal Medicine

## 2022-09-20 NOTE — Telephone Encounter (Signed)
Medication: amphetamine-dextroamphetamine (ADDERALL XR) 30 MG 24 hr capsule   Has the patient contacted their pharmacy?   Preferred Pharmacy (with phone number or street name):[id number] - [place], [place] - [address] AT [facility] OF [address], [place] [address] Phone: [phone]  Fax: [phone]  Agent: Please be advised that RX refills may take up to 3 business days. We ask that you follow-up with your pharmacy.

## 2022-09-21 ENCOUNTER — Ambulatory Visit: Payer: No Typology Code available for payment source | Admitting: Internal Medicine

## 2022-09-21 NOTE — Telephone Encounter (Signed)
Refill on file already for November at pharmacy.

## 2022-10-02 ENCOUNTER — Ambulatory Visit: Payer: No Typology Code available for payment source | Admitting: Internal Medicine

## 2023-01-08 ENCOUNTER — Ambulatory Visit: Payer: Self-pay

## 2023-01-08 ENCOUNTER — Ambulatory Visit (INDEPENDENT_AMBULATORY_CARE_PROVIDER_SITE_OTHER): Payer: No Typology Code available for payment source | Admitting: Family Medicine

## 2023-01-08 ENCOUNTER — Ambulatory Visit (INDEPENDENT_AMBULATORY_CARE_PROVIDER_SITE_OTHER): Payer: No Typology Code available for payment source

## 2023-01-08 ENCOUNTER — Encounter: Payer: Self-pay | Admitting: Family Medicine

## 2023-01-08 VITALS — BP 136/94 | HR 94 | Ht 66.0 in | Wt 198.4 lb

## 2023-01-08 DIAGNOSIS — M25572 Pain in left ankle and joints of left foot: Secondary | ICD-10-CM | POA: Diagnosis not present

## 2023-01-08 DIAGNOSIS — M76822 Posterior tibial tendinitis, left leg: Secondary | ICD-10-CM | POA: Diagnosis not present

## 2023-01-08 MED ORDER — NITROGLYCERIN 0.2 MG/HR TD PT24: MEDICATED_PATCH | TRANSDERMAL | 1 refills | Status: DC

## 2023-01-08 NOTE — Progress Notes (Unsigned)
I, Peterson Lombard, LAT, ATC acting as a scribe for Lynne Leader, MD.  Subjective:    CC: L foot/ankle pain  HPI: Pt is a 52 y/o male c/o L foot/ankle pain x 3 weeks. MOI: lifting 950 lb doing leg press. Pt locates pain to medial aspect of the ankle. Flat feet. Hx of tendon tear in left foot. Wears orthotics. Does a lot of walking on concrete floors for work. Has been wearing brace. Pain worse at night. The ankle seems to lock up throughout the night causing excruciating pain with first steps. Sx radiate into the lower leg. Stiffness causing abnormal gait.   L foot/ankle swelling; mild Aggravates: worse at night Treatments tried: splinting/bracing.   Pertinent review of Systems: No fevers or chills.  Relevant historical information: History prior posterior tibialis tendon tear or tendinitis.   Objective:    Vitals:   01/08/23 1405  BP: (!) 136/94  Pulse: 94  SpO2: 97%   General: Well Developed, well nourished, and in no acute distress.   MSK: Left ankle: Some swelling along the medial aspect of the ankle. Significant pes planus and ankle pronation. Tender palpation about the tarsal tunnel area. Normal ankle motion. Pain with resisted foot plantarflexion and eversion. Pulses cap refill sensation intact distally.  Lab and Radiology Results  Diagnostic Limited MSK Ultrasound of: Left medial ankle Posterior tibialis tendon visualized.  Significant hypoechoic fluid tracking in the tendon sheath around the posterior tibialis tendon with massive fluid accumulation just distal to the tarsal tunnel area. The tendon does not have a full-thickness tear that I can see however the tendon does appear to be partially torn or disrupted as it transitions across the tarsal tunnel. Impression: Possible partial tear of the posterior tibialis tendon with very significant tenosynovitis.  X-ray images left ankle obtained today personally and independently interpreted No acute fractures.   Soft tissue swelling along medial ankle Await formal radiology review  Impression and Recommendations:    Assessment and Plan: 52 y.o. male with left medial ankle pain.  Patient has a history of partial tear of the posterior tibialis tendon in the past.  He reinjured the tendon lifting weights recently and has symptoms that are consistent with tear.  On ultrasound he has evidence of significant tenosynovitis and partial tear.  I do not see a full-thickness tear on ultrasound but I could be missing it. Plan to treat with cam walker boot for a week or 2 and start physical therapy in a week or 2.  Will use nitroglycerin patch protocol.  Recheck in 1 month.  Consider MRI if not improving pretty quickly. Also recommend night splint  PDMP not reviewed this encounter. Orders Placed This Encounter  Procedures   [place] LIMITED JOINT SPACE STRUCTURES LOW LEFT(NO LINKED CHARGES)    Order Specific Question:   Reason for Exam (SYMPTOM  OR DIAGNOSIS REQUIRED)    Answer:   left ankle/foot pain    Order Specific Question:   Preferred imaging location?    Answer:   [facility]   DG Ankle Complete Left    Standing Status:   Future    Number of Occurrences:   1    Standing Expiration Date:   01/08/2024    Order Specific Question:   Reason for Exam (SYMPTOM  OR DIAGNOSIS REQUIRED)    Answer:   eval left ankle    Order Specific Question:   Preferred imaging location?    Answer:   [facility]  Ambulatory referral to Physical Therapy    Referral Priority:   Routine    Referral Type:   Physical Medicine    Referral Reason:   Specialty Services Required    Requested Specialty:   Physical Therapy    Number of Visits Requested:   1   Meds ordered this encounter  Medications   nitroGLYCERIN (NITRODUR - DOSED IN MG/24 HR) 0.2 mg/hr patch    Sig: Apply 1/4 patch daily to tendon for tendonitis.    Dispense:  30 patch    Refill:  1    Discussed warning signs or symptoms. Please  see discharge instructions. Patient expresses understanding.   The above documentation has been reviewed and is accurate and complete Lynne Leader, M.D.

## 2023-01-08 NOTE — Patient Instructions (Addendum)
Thank you for coming in today.   Please get an Xray today before you leave   Use a cam walker boot for a few weeks.   Use a Night Splint for Plantar Fasciitis as needed.   Use nitro patches.   Recheck in 1 month.

## 2023-01-09 NOTE — Progress Notes (Signed)
Left ankle x-ray shows a little bit of arthritis.

## 2023-01-21 NOTE — Therapy (Signed)
OUTPATIENT PHYSICAL THERAPY LOWER EXTREMITY EVALUATION   Patient Name: Kyle Dunn MRN: [id number] DOB:1971/06/16, 52 y.o., male Today's Date: 01/22/2023  END OF SESSION:  PT End of Session - 01/22/23 1232     Visit Number 1    Date for PT Re-Evaluation 04/16/23    Authorization Type [employer]    PT Start Time 1230    PT Stop Time 1315    PT Time Calculation (min) 45 min    Behavior During Therapy WFL for tasks assessed/performed             Past Medical History:  Diagnosis Date   ADD (attention deficit disorder)    Herniated disc, cervical    x2   Pain, neck 2019   w/ L radiculopathy, better w/ conservative RX   Past Surgical History:  Procedure Laterality Date   KNEE ARTHROSCOPY     R at age 15   Patient Active Problem List   Diagnosis Date Noted   Gynecomastia 10/15/2019   Dyslipidemia 04/17/2019   Cervical disc disorder with radiculopathy of cervical region 07/24/2016   Annual physical exam 02/02/2016   PCP NOTES >>>>>>>>>>>>>>>>>>>>>>>>>>>>>>>> 02/02/2016   Patellar tendinitis of right knee 02/15/2015   Gait abnormality 03/26/2012   Posterior tibial tendinitis of left leg 02/12/2012   Attention deficit disorder 11/13/2006    PCP: Kathlene November  REFERRING PROVIDER: Lynne Leader  REFERRING DIAG: [id number], [phone]  THERAPY DIAG:  Pain in left ankle and joints of left foot  Stiffness of left ankle, not elsewhere classified  Tenosynovitis of tibialis posterior tendon  Left tibialis posterior tendonitis  Rationale for Evaluation and Treatment: Rehabilitation  ONSET DATE: 01/08/23  SUBJECTIVE:   SUBJECTIVE STATEMENT: I was doing the leg press, I did 900lbs on it and then I was limping. 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 My feet are very flat. Been wearing a boot an week and a half ago and I am wearing a brace underneath to help with my arches otherwise my foot was hurting in just the boot. Having trouble sleeping at night and it is  worse in the mornings.   PERTINENT HISTORY: 52 y.o. male with left medial ankle pain.  Patient has a history of partial tear of the posterior tibialis tendon in the past.  He reinjured the tendon lifting weights recently and has symptoms that are consistent with tear.  On ultrasound he has evidence of significant tenosynovitis and partial tear.  I do not see a full-thickness tear on ultrasound but I could be missing it. Plan to treat with cam walker boot for a week or 2 and start physical therapy in a week or 2.  Will use nitroglycerin patch protocol.  Recheck in 1 month.  Consider MRI if not improving pretty quickly. Also recommend night splint  PAIN:  Are you having pain? Yes: NPRS scale: 6/10 Pain location: L ankle, medial side but it radiates to the outside and front Pain description: dull and achy Aggravating factors: walking w/o shoes, walking in general  Relieving factors: pain meds, nitro patches  PRECAUTIONS: None  WEIGHT BEARING RESTRICTIONS: No  FALLS:  Has patient fallen in last 6 months? No  LIVING ENVIRONMENT: Lives with: lives with their family Lives in: House/apartment Stairs: Yes: Internal: 20 steps; wall on one side Has following equipment at home: None  OCCUPATION: owns a factory   PLOF: Independent  PATIENT GOALS: I want to play golf soon, no pain and figure out what exercises  I can do without further damaging it    OBJECTIVE:   DIAGNOSTIC FINDINGS: Diagnostic Limited MSK Ultrasound of: Left medial ankle Posterior tibialis tendon visualized.  Significant hypoechoic fluid tracking in the tendon sheath around the posterior tibialis tendon with massive fluid accumulation just distal to the tarsal tunnel area. The tendon does not have a full-thickness tear that I can see however the tendon does appear to be partially torn or disrupted as it transitions across the tarsal tunnel.  Impression: Possible partial tear of the posterior tibialis tendon with very  significant tenosynovitis.  PATIENT SURVEYS:  FOTO 40  COGNITION: Overall cognitive status: Within functional limits for tasks assessed     SENSATION: WFL  PALPATION: Swelling and warm, TTP around posterior tib tendon  LOWER EXTREMITY ROM: grossly WFL, some loss of motion with DF due to increased swelling   LOWER EXTREMITY MMT: 5/5   GAIT: Distance walked: in clinic Assistive device utilized: None Level of assistance: Modified independence Comments: antalgic gait, dons a CAM boot,    TODAY'S TREATMENT:                                                                                                                              DATE: 01/22/23-EVAL     PATIENT EDUCATION:  Education details: HEP and POC Person educated: Patient Education method: Explanation Education comprehension: verbalized understanding  HOME EXERCISE PROGRAM: Access Code: [email or web address] URL: [email or web address] Date: 01/22/2023 Prepared by: [place] with Counter Support  - 1 x daily - 7 x weekly - 2 sets - 10 reps - Heel Toe Raises with Counter Support  - 1 x daily - 7 x weekly - 2 sets - 10 reps - Ankle Dorsiflexion with Resistance  - 1 x daily - 7 x weekly - 2 sets - 10 reps - Ankle Eversion with Resistance  - 1 x daily - 7 x weekly - 2 sets - 10 reps - Ankle Inversion with Resistance  - 1 x daily - 7 x weekly - 2 sets - 10 reps - Long Sitting Ankle Plantar Flexion with Resistance  - 1 x daily - 7 x weekly - 2 sets - 10 reps - Gastroc Stretch on Wall  - 1 x daily - 7 x weekly - 30 hold - Seated Self Great Toe Stretch  - 1 x daily - 7 x weekly - 30 hold  ASSESSMENT:  CLINICAL IMPRESSION: Patient is a 52 y.o. male who was seen today for physical therapy evaluation and treatment for L ankle pain. He presents with increased swelling and pain in his L ankle. His images show possible partial tear of the posterior tibialis tendon with very significant  tenosynovitis. He is currently wearing a CAM boot all day; he runs a factory which requires him to be on his feet and walking a lot. He takes the boot off at home sometimes. He is extremely flat footed but has good  support in his shoes. Patient reports high pain levels especially in the mornings and any time he walks. He has been using nitro patches, TEN unit, icing and heating. Was advised to stop heat and try just ice for now to decrease edema and target inflammation. He is very active and was also advised to try to slow down and decreased his activity to allow some healing, but with his busy schedule this is hard for him.  We discussed PT options including ice, ultrasound, and ionto patches with stretching and strengthening. He will benefit from PT to address his L ankle pain and swelling to be able to walk, complete his daily activities, and be able to play golf again.   OBJECTIVE IMPAIRMENTS: difficulty walking, increased edema, and pain.   ACTIVITY LIMITATIONS: squatting, stairs, and locomotion level  REHAB POTENTIAL: Good  CLINICAL DECISION MAKING: Stable/uncomplicated  EVALUATION COMPLEXITY: Low   GOALS: Goals reviewed with patient? Yes  SHORT TERM GOALS: Target date: 03/05/23  Patient will be independent with initial HEP. Goal status: INITIAL  2.  Patient will present with at least 50% decrease in L ankle swelling  Goal status: INITIAL   LONG TERM GOALS: Target date: 04/16/23  Patient will be independent with advanced/ongoing HEP to improve outcomes and carryover.  Goal status: INITIAL  2.  Patient will report at least 75% improvement in L ankle/foot pain to improve QOL. Baseline: 6/10 but 10/10 in mornings Goal status: INITIAL  3.  Patient will be able to ambulate 500' with normal gait pattern without increased foot/ankle pain to access community.  Baseline: pain with any distance Goal status: INITIAL  4. Patient will be able to ascend/descend stairs without pain to  access home and community.  Baseline: pain going up and down Goal status: INITIAL  5.  Patient will report 32 on FOTO to demonstrate improved functional ability. Baseline: 40 Goal status: INITIAL   PLAN:  PT FREQUENCY: 2x/week  PT DURATION: 12 weeks  PLANNED INTERVENTIONS: Therapeutic exercises, Therapeutic activity, Neuromuscular re-education, Balance training, Gait training, Patient/Family education, Self Care, Joint mobilization, Stair training, Dry Needling, Electrical stimulation, Cryotherapy, Moist heat, Taping, Vasopneumatic device, Ultrasound, Ionotophoresis 4mg /ml Dexamethasone, and Manual therapy  PLAN FOR NEXT SESSION: light strengthening for L ankle, mostly work on pain management- vaso, ionto, maybe ultrasound and stretching    [employer], PT 01/22/2023, 1:27 PM

## 2023-01-22 ENCOUNTER — Ambulatory Visit: Payer: No Typology Code available for payment source | Attending: Family Medicine

## 2023-01-22 ENCOUNTER — Telehealth: Payer: Self-pay | Admitting: Internal Medicine

## 2023-01-22 DIAGNOSIS — R262 Difficulty in walking, not elsewhere classified: Secondary | ICD-10-CM | POA: Insufficient documentation

## 2023-01-22 DIAGNOSIS — M76822 Posterior tibial tendinitis, left leg: Secondary | ICD-10-CM | POA: Insufficient documentation

## 2023-01-22 DIAGNOSIS — M25572 Pain in left ankle and joints of left foot: Secondary | ICD-10-CM | POA: Insufficient documentation

## 2023-01-22 DIAGNOSIS — M659 Synovitis and tenosynovitis, unspecified: Secondary | ICD-10-CM | POA: Diagnosis present

## 2023-01-22 NOTE — Addendum Note (Signed)
Addended byDamita Dunnings D on: 01/22/2023 02:03 PM   Modules accepted: Orders

## 2023-01-22 NOTE — Telephone Encounter (Signed)
Requesting: Adderall XR 30mg   Contract:04/11/21 UDS: 04/11/21 Last Visit: 05/18/22 Next Visit: None Last Refill: 08/09/22 #30 and 0RF (x2)  Please Advise

## 2023-01-22 NOTE — Telephone Encounter (Signed)
Prescription Request  01/22/2023  Is this a "Controlled Substance" medicine? Yes  LOV: Visit date not found  What is the name of the medication or equipment?   amphetamine-dextroamphetamine (ADDERALL XR) 30 MG 24 hr capsule [id number]   Have you contacted your pharmacy to request a refill? No   Which pharmacy would you like this sent to?    [facility], [place], [place] [address] P: [phone]  Patient notified that their request is being sent to the clinical staff for review and that they should receive a response within 2 business days.   Please advise at Mobile [phone] (mobile)

## 2023-01-23 MED ORDER — AMPHETAMINE-DEXTROAMPHET ER 30 MG PO CP24: 30.0000 mg | ORAL_CAPSULE | ORAL | 0 refills | Status: DC

## 2023-01-23 NOTE — Addendum Note (Signed)
Addended by: Colon Branch on: 01/23/2023 09:50 AM   Modules accepted: Orders

## 2023-01-23 NOTE — Telephone Encounter (Signed)
PDMP okay, Rx sent 

## 2023-01-25 ENCOUNTER — Ambulatory Visit: Payer: No Typology Code available for payment source

## 2023-01-29 ENCOUNTER — Telehealth: Payer: Self-pay

## 2023-01-29 ENCOUNTER — Other Ambulatory Visit: Payer: Self-pay | Admitting: Sports Medicine

## 2023-01-29 MED ORDER — MELOXICAM 15 MG PO TABS: 15.0000 mg | ORAL_TABLET | Freq: Every day | ORAL | 0 refills | Status: DC

## 2023-01-29 NOTE — Telephone Encounter (Signed)
Called patient with DO response patient stated understanding.

## 2023-01-29 NOTE — Telephone Encounter (Signed)
Patient called this morning and he sees Dr. Georgina Snell for his plantar fascitis and wears a brace and a boot. This morning he was trying to put his boot on and got this "ceasing feeling" in that ankle and states the pain is located at the Tendon coming around his ankle and feels like it has filled up with fluid or something but he cannot put any pressure on his foot.  Patient has tore this tendon before and is worried that he did something to it. Patient has PT tomorrow but wanted to know what Dr. Georgina Snell could do or what his thoughts were. Should he get in to see someone, get something sent in for him (he is on nitroglycerin patch), if he should stop wearing the boot as it hurts worse with the boot. Patient had to scoot down the steps on his butt because he couldn't stand.   I informed patient that Dr. Georgina Snell is out this week and that we have availability Thursday or he may need to go to urgent care or [facility]. Patient wanted me to send a note to Dr. Glennon Mac to see what his thoughts were.

## 2023-01-30 ENCOUNTER — Encounter: Payer: Self-pay | Admitting: Physical Therapy

## 2023-01-30 ENCOUNTER — Ambulatory Visit: Payer: No Typology Code available for payment source | Admitting: Physical Therapy

## 2023-01-30 ENCOUNTER — Telehealth: Payer: Self-pay | Admitting: Family Medicine

## 2023-01-30 DIAGNOSIS — R262 Difficulty in walking, not elsewhere classified: Secondary | ICD-10-CM

## 2023-01-30 DIAGNOSIS — M76822 Posterior tibial tendinitis, left leg: Secondary | ICD-10-CM

## 2023-01-30 DIAGNOSIS — M659 Synovitis and tenosynovitis, unspecified: Secondary | ICD-10-CM

## 2023-01-30 DIAGNOSIS — M25572 Pain in left ankle and joints of left foot: Secondary | ICD-10-CM

## 2023-01-30 NOTE — Therapy (Signed)
OUTPATIENT PHYSICAL THERAPY LOWER EXTREMITY TREATMENT   Patient Name: Chanel Hovey MRN: [id number] DOB:[birth year], 52 y.o., male Today's Date: 01/30/2023  END OF SESSION:  PT End of Session - 01/30/23 0845     Visit Number 2    Date for PT Re-Evaluation 04/16/23    PT Start Time 0845    PT Stop Time 0930    PT Time Calculation (min) 45 min    Activity Tolerance Patient tolerated treatment well    Behavior During Therapy [facility] for tasks assessed/performed             Past Medical History:  Diagnosis Date   ADD (attention deficit disorder)    Herniated disc, cervical    x2   Pain, neck 2019   w/ L radiculopathy, better w/ conservative RX   Past Surgical History:  Procedure Laterality Date   KNEE ARTHROSCOPY     R at age 89   Patient Active Problem List   Diagnosis Date Noted   Gynecomastia 10/15/2019   Dyslipidemia 04/17/2019   Cervical disc disorder with radiculopathy of cervical region 07/24/2016   Annual physical exam 02/02/2016   PCP NOTES >>>>>>>>>>>>>>>>>>>>>>>>>>>>>>>> 02/02/2016   Patellar tendinitis of right knee 02/15/2015   Gait abnormality 03/26/2012   Posterior tibial tendinitis of left leg 02/12/2012   Attention deficit disorder 11/13/2006    PCP: Kathlene November  REFERRING PROVIDER: Lynne Leader  REFERRING DIAG: [id number], [id number]  THERAPY DIAG:  Pain in left ankle and joints of left foot  Tenosynovitis of tibialis posterior tendon  Difficulty in walking, not elsewhere classified  Left tibialis posterior tendonitis  Rationale for Evaluation and Treatment: Rehabilitation  ONSET DATE: 01/08/23  SUBJECTIVE:   SUBJECTIVE STATEMENT: "Im hurting"   PERTINENT HISTORY: 52 y.o. male with left medial ankle pain.  Patient has a history of partial tear of the posterior tibialis tendon in the past.  He reinjured the tendon lifting weights recently and has symptoms that are consistent with tear.  On ultrasound he has evidence of significant  tenosynovitis and partial tear.  I do not see a full-thickness tear on ultrasound but I could be missing it. Plan to treat with cam walker boot for a week or 2 and start physical therapy in a week or 2.  Will use nitroglycerin patch protocol.  Recheck in 1 month.  Consider MRI if not improving pretty quickly. Also recommend night splint  PAIN:  Are you having pain? Yes: NPRS scale: 4/10 Pain location: L ankle, medial side but it radiates to the outside and front Pain description: dull and achy Aggravating factors: walking w/o shoes, walking in general  Relieving factors: pain meds, nitro patches  PRECAUTIONS: None  WEIGHT BEARING RESTRICTIONS: No  FALLS:  Has patient fallen in last 6 months? No  LIVING ENVIRONMENT: Lives with: lives with their family Lives in: House/apartment Stairs: Yes: Internal: 20 steps; wall on one side Has following equipment at home: None  OCCUPATION: owns a factory   PLOF: Independent  PATIENT GOALS: I want to play golf soon, no pain and figure out what exercises I can do without further damaging it    OBJECTIVE:   DIAGNOSTIC FINDINGS: Diagnostic Limited MSK Ultrasound of: Left medial ankle Posterior tibialis tendon visualized.  Significant hypoechoic fluid tracking in the tendon sheath around the posterior tibialis tendon with massive fluid accumulation just distal to the tarsal tunnel area. The tendon does not have a full-thickness tear that I can see however the tendon does appear to be  partially torn or disrupted as it transitions across the tarsal tunnel.  Impression: Possible partial tear of the posterior tibialis tendon with very significant tenosynovitis.  PATIENT SURVEYS:  FOTO 40  COGNITION: Overall cognitive status: Within functional limits for tasks assessed     SENSATION: WFL  PALPATION: Swelling and warm, TTP around posterior tib tendon  LOWER EXTREMITY ROM: grossly WFL, some loss of motion with DF due to increased  swelling   LOWER EXTREMITY MMT: 5/5   GAIT: Distance walked: in clinic Assistive device utilized: None Level of assistance: Modified independence Comments: antalgic gait, dons a CAM boot,    TODAY'S TREATMENT:                                                                                                                              DATE: L ankle AROM in all directions Ankle 4 way Tband red x15 Ankle ROM sit fit all directions [place] Posterior tibias tendon 3.3Mhz 1.1 w/cm2    01/22/23-EVAL     PATIENT EDUCATION:  Education details: HEP and POC Person educated: Patient Education method: Explanation Education comprehension: verbalized understanding  HOME EXERCISE PROGRAM: Access Code: [email or web address] URL: [email or web address] Date: 01/22/2023 Prepared by: [place] with Counter Support  - 1 x daily - 7 x weekly - 2 sets - 10 reps - Heel Toe Raises with Counter Support  - 1 x daily - 7 x weekly - 2 sets - 10 reps - Ankle Dorsiflexion with Resistance  - 1 x daily - 7 x weekly - 2 sets - 10 reps - Ankle Eversion with Resistance  - 1 x daily - 7 x weekly - 2 sets - 10 reps - Ankle Inversion with Resistance  - 1 x daily - 7 x weekly - 2 sets - 10 reps - Long Sitting Ankle Plantar Flexion with Resistance  - 1 x daily - 7 x weekly - 2 sets - 10 reps - Gastroc Stretch on Wall  - 1 x daily - 7 x weekly - 30 hold - Seated Self Great Toe Stretch  - 1 x daily - 7 x weekly - 30 hold  ASSESSMENT:  CLINICAL IMPRESSION: Patient is a 52 y.o. male who was seen today for physical therapy treatment for L ankle pain. He presents with increased swelling and pain in his L ankle. His images show possible partial tear of the posterior tibialis tendon with very significant tenosynovitis. Session consisted of light ankle strength and stretching. [place] to promote healing. Pt reports no relief over the past month. Advised him to follow up with MD     OBJECTIVE  IMPAIRMENTS: difficulty walking, increased edema, and pain.   ACTIVITY LIMITATIONS: squatting, stairs, and locomotion level  REHAB POTENTIAL: Good  CLINICAL DECISION MAKING: Stable/uncomplicated  EVALUATION COMPLEXITY: Low   GOALS: Goals reviewed with patient? Yes  SHORT TERM GOALS: Target date: 03/05/23  Patient will be independent with initial HEP. Goal status:  INITIAL  2.  Patient will present with at least 50% decrease in L ankle swelling  Goal status: INITIAL   LONG TERM GOALS: Target date: 04/16/23  Patient will be independent with advanced/ongoing HEP to improve outcomes and carryover.  Goal status: INITIAL  2.  Patient will report at least 75% improvement in L ankle/foot pain to improve QOL. Baseline: 6/10 but 10/10 in mornings Goal status: INITIAL  3.  Patient will be able to ambulate 500' with normal gait pattern without increased foot/ankle pain to access community.  Baseline: pain with any distance Goal status: INITIAL  4. Patient will be able to ascend/descend stairs without pain to access home and community.  Baseline: pain going up and down Goal status: INITIAL  5.  Patient will report 50 on FOTO to demonstrate improved functional ability. Baseline: 40 Goal status: INITIAL   PLAN:  PT FREQUENCY: 2x/week  PT DURATION: 12 weeks  PLANNED INTERVENTIONS: Therapeutic exercises, Therapeutic activity, Neuromuscular re-education, Balance training, Gait training, Patient/Family education, Self Care, Joint mobilization, Stair training, Dry Needling, Electrical stimulation, Cryotherapy, Moist heat, Taping, Vasopneumatic device, Ultrasound, Ionotophoresis 4mg /ml Dexamethasone, and Manual therapy  PLAN FOR NEXT SESSION: light strengthening for L ankle, mostly work on pain management- vaso, ionto, maybe ultrasound and stretching    Scot Jun, PTA 01/30/2023, 8:46 AM

## 2023-01-30 NOTE — Telephone Encounter (Signed)
Patient called stating that he is still having a lot pain in his plantar fascitis and ankle with no improvement from PT. He asked if we could go ahead and order an MRI for him?

## 2023-01-30 NOTE — Telephone Encounter (Signed)
Per visit note 01/08/23:  Assessment and Plan: 52 y.o. male with left medial ankle pain.  Patient has a history of partial tear of the posterior tibialis tendon in the past.  He reinjured the tendon lifting weights recently and has symptoms that are consistent with tear.  On ultrasound he has evidence of significant tenosynovitis and partial tear.  I do not see a full-thickness tear on ultrasound but I could be missing it. Plan to treat with cam walker boot for a week or 2 and start physical therapy in a week or 2.  Will use nitroglycerin patch protocol.  Recheck in 1 month.  Consider MRI if not improving pretty quickly. Also recommend night splint   Order placed for MRI Left Ankle w/o CM to [facility].   Forwarding to Dr. Georgina Snell to confirm order when he returns to the office next week.

## 2023-02-05 ENCOUNTER — Ambulatory Visit (INDEPENDENT_AMBULATORY_CARE_PROVIDER_SITE_OTHER): Payer: No Typology Code available for payment source

## 2023-02-05 DIAGNOSIS — M25572 Pain in left ankle and joints of left foot: Secondary | ICD-10-CM | POA: Diagnosis not present

## 2023-02-05 DIAGNOSIS — M76822 Posterior tibial tendinitis, left leg: Secondary | ICD-10-CM | POA: Diagnosis not present

## 2023-02-05 NOTE — Therapy (Signed)
OUTPATIENT PHYSICAL THERAPY LOWER EXTREMITY TREATMENT   Patient Name: Kyle Dunn MRN: [id number] DOB:[birth year], 52 y.o., male Today's Date: 02/06/2023  END OF SESSION:  PT End of Session - 02/06/23 0802     Visit Number 3    Date for PT Re-Evaluation 04/16/23    PT Start Time 0800    PT Stop Time 0845    PT Time Calculation (min) 45 min    Activity Tolerance Patient tolerated treatment well    Behavior During Therapy [facility] for tasks assessed/performed             Past Medical History:  Diagnosis Date   ADD (attention deficit disorder)    Herniated disc, cervical    x2   Pain, neck 2019   w/ L radiculopathy, better w/ conservative RX   Past Surgical History:  Procedure Laterality Date   KNEE ARTHROSCOPY     R at age 64   Patient Active Problem List   Diagnosis Date Noted   Gynecomastia 10/15/2019   Dyslipidemia 04/17/2019   Cervical disc disorder with radiculopathy of cervical region 07/24/2016   Annual physical exam 02/02/2016   PCP NOTES >>>>>>>>>>>>>>>>>>>>>>>>>>>>>>>> 02/02/2016   Patellar tendinitis of right knee 02/15/2015   Gait abnormality 03/26/2012   Posterior tibial tendinitis of left leg 02/12/2012   Attention deficit disorder 11/13/2006    PCP: Kathlene November  REFERRING PROVIDER: Lynne Leader  REFERRING DIAG: [id number], [phone]  THERAPY DIAG:  Pain in left ankle and joints of left foot  Tenosynovitis of tibialis posterior tendon  Difficulty in walking, not elsewhere classified  Left tibialis posterior tendonitis  Rationale for Evaluation and Treatment: Rehabilitation  ONSET DATE: 01/08/23  SUBJECTIVE:   SUBJECTIVE STATEMENT: Have not been wearing the boot since Saturday, did the MRI yesterday. Still having pain, sometimes it is sharp sometimes it is dull.   PERTINENT HISTORY: 52 y.o. male with left medial ankle pain.  Patient has a history of partial tear of the posterior tibialis tendon in the past.  He reinjured the tendon  lifting weights recently and has symptoms that are consistent with tear.  On ultrasound he has evidence of significant tenosynovitis and partial tear.  I do not see a full-thickness tear on ultrasound but I could be missing it. Plan to treat with cam walker boot for a week or 2 and start physical therapy in a week or 2.  Will use nitroglycerin patch protocol.  Recheck in 1 month.  Consider MRI if not improving pretty quickly. Also recommend night splint  PAIN:  Are you having pain? Yes: NPRS scale: 5/10 Pain location: L ankle, medial side but it radiates to the outside and front Pain description: dull and achy Aggravating factors: walking w/o shoes, walking in general  Relieving factors: pain meds, nitro patches  PRECAUTIONS: None  WEIGHT BEARING RESTRICTIONS: No  FALLS:  Has patient fallen in last 6 months? No  LIVING ENVIRONMENT: Lives with: lives with their family Lives in: House/apartment Stairs: Yes: Internal: 20 steps; wall on one side Has following equipment at home: None  OCCUPATION: owns a factory   PLOF: Independent  PATIENT GOALS: I want to play golf soon, no pain and figure out what exercises I can do without further damaging it    OBJECTIVE:   DIAGNOSTIC FINDINGS: Diagnostic Limited MSK Ultrasound of: Left medial ankle Posterior tibialis tendon visualized.  Significant hypoechoic fluid tracking in the tendon sheath around the posterior tibialis tendon with massive fluid accumulation just distal to the tarsal  tunnel area. The tendon does not have a full-thickness tear that I can see however the tendon does appear to be partially torn or disrupted as it transitions across the tarsal tunnel.  Impression: Possible partial tear of the posterior tibialis tendon with very significant tenosynovitis.  PATIENT SURVEYS:  FOTO 40  COGNITION: Overall cognitive status: Within functional limits for tasks assessed     SENSATION: WFL  PALPATION: Swelling and warm, TTP  around posterior tib tendon  LOWER EXTREMITY ROM: grossly WFL, some loss of motion with DF due to increased swelling   LOWER EXTREMITY MMT: 5/5   GAIT: Distance walked: in clinic Assistive device utilized: None Level of assistance: Modified independence Comments: antalgic gait, dons a CAM boot,    TODAY'S TREATMENT:                                                                                                                              DATE: 02/06/23 Ankle rocker board seated  L ankle TB red x20  Bike L2 x73mins  Calf stretch on slant 30s  Calf raises 2x10 [place] Posterior tibias tendon 3.3Mhz 1.1 w/cm2 PROM to L ankle all directions   01/30/23 L ankle AROM in all directions Ankle 4 way Tband red x15 Ankle ROM sit fit all directions   01/22/23-EVAL     PATIENT EDUCATION:  Education details: HEP and POC Person educated: Patient Education method: Explanation Education comprehension: verbalized understanding  HOME EXERCISE PROGRAM: Access Code: [email or web address] URL: [email or web address] Date: 01/22/2023 Prepared by: [place] with Counter Support  - 1 x daily - 7 x weekly - 2 sets - 10 reps - Heel Toe Raises with Counter Support  - 1 x daily - 7 x weekly - 2 sets - 10 reps - Ankle Dorsiflexion with Resistance  - 1 x daily - 7 x weekly - 2 sets - 10 reps - Ankle Eversion with Resistance  - 1 x daily - 7 x weekly - 2 sets - 10 reps - Ankle Inversion with Resistance  - 1 x daily - 7 x weekly - 2 sets - 10 reps - Long Sitting Ankle Plantar Flexion with Resistance  - 1 x daily - 7 x weekly - 2 sets - 10 reps - Gastroc Stretch on Wall  - 1 x daily - 7 x weekly - 30 hold - Seated Self Great Toe Stretch  - 1 x daily - 7 x weekly - 30 hold  ASSESSMENT:  CLINICAL IMPRESSION: Patient returns this time not wearing boot, he stopped wearing it on Saturday. He states it has been feeling a little bit better. Thinks [place] may have done something to  help. Session consisted of light ankle strength and stretching. Did [place] again to promote healing.   OBJECTIVE IMPAIRMENTS: difficulty walking, increased edema, and pain.   ACTIVITY LIMITATIONS: squatting, stairs, and locomotion level  REHAB POTENTIAL: Good  CLINICAL DECISION MAKING: Stable/uncomplicated  EVALUATION COMPLEXITY:  Low   GOALS: Goals reviewed with patient? Yes  SHORT TERM GOALS: Target date: 03/05/23  Patient will be independent with initial HEP. Goal status: INITIAL  2.  Patient will present with at least 50% decrease in L ankle swelling  Goal status: INITIAL   LONG TERM GOALS: Target date: 04/16/23  Patient will be independent with advanced/ongoing HEP to improve outcomes and carryover.  Goal status: INITIAL  2.  Patient will report at least 75% improvement in L ankle/foot pain to improve QOL. Baseline: 6/10 but 10/10 in mornings Goal status: INITIAL  3.  Patient will be able to ambulate 500' with normal gait pattern without increased foot/ankle pain to access community.  Baseline: pain with any distance Goal status: INITIAL  4. Patient will be able to ascend/descend stairs without pain to access home and community.  Baseline: pain going up and down Goal status: INITIAL  5.  Patient will report 51 on FOTO to demonstrate improved functional ability. Baseline: 40 Goal status: INITIAL   PLAN:  PT FREQUENCY: 2x/week  PT DURATION: 12 weeks  PLANNED INTERVENTIONS: Therapeutic exercises, Therapeutic activity, Neuromuscular re-education, Balance training, Gait training, Patient/Family education, Self Care, Joint mobilization, Stair training, Dry Needling, Electrical stimulation, Cryotherapy, Moist heat, Taping, Vasopneumatic device, Ultrasound, Ionotophoresis 4mg /ml Dexamethasone, and Manual therapy  PLAN FOR NEXT SESSION: light strengthening for L ankle, mostly work on pain management- vaso, ionto, maybe ultrasound and stretching    [employer],  PT 02/06/2023, 8:44 AM

## 2023-02-06 ENCOUNTER — Ambulatory Visit: Payer: No Typology Code available for payment source | Attending: Family Medicine

## 2023-02-06 DIAGNOSIS — M659 Synovitis and tenosynovitis, unspecified: Secondary | ICD-10-CM | POA: Diagnosis present

## 2023-02-06 DIAGNOSIS — M76822 Posterior tibial tendinitis, left leg: Secondary | ICD-10-CM | POA: Diagnosis present

## 2023-02-06 DIAGNOSIS — M25572 Pain in left ankle and joints of left foot: Secondary | ICD-10-CM | POA: Diagnosis present

## 2023-02-06 DIAGNOSIS — R262 Difficulty in walking, not elsewhere classified: Secondary | ICD-10-CM | POA: Insufficient documentation

## 2023-02-07 NOTE — Therapy (Signed)
OUTPATIENT PHYSICAL THERAPY LOWER EXTREMITY TREATMENT   Patient Name: Kyle Dunn MRN: [id number] DOB:Oct 26, 1971, 52 y.o., male Today's Date: 02/08/2023  END OF SESSION:  PT End of Session - 02/08/23 0800     Visit Number 4    Date for PT Re-Evaluation 04/16/23    PT Start Time 0800    PT Stop Time 0845    PT Time Calculation (min) 45 min    Activity Tolerance Patient tolerated treatment well    Behavior During Therapy [facility] for tasks assessed/performed              Past Medical History:  Diagnosis Date   ADD (attention deficit disorder)    Herniated disc, cervical    x2   Pain, neck 2019   w/ L radiculopathy, better w/ conservative RX   Past Surgical History:  Procedure Laterality Date   KNEE ARTHROSCOPY     R at age 30   Patient Active Problem List   Diagnosis Date Noted   Gynecomastia 10/15/2019   Dyslipidemia 04/17/2019   Cervical disc disorder with radiculopathy of cervical region 07/24/2016   Annual physical exam 02/02/2016   PCP NOTES >>>>>>>>>>>>>>>>>>>>>>>>>>>>>>>> 02/02/2016   Patellar tendinitis of right knee 02/15/2015   Gait abnormality 03/26/2012   Posterior tibial tendinitis of left leg 02/12/2012   Attention deficit disorder 11/13/2006    PCP: Willow Ora  REFERRING PROVIDER: Clementeen Graham  REFERRING DIAG: [id number], [phone]  THERAPY DIAG:  Pain in left ankle and joints of left foot  Tenosynovitis of tibialis posterior tendon  Difficulty in walking, not elsewhere classified  Left tibialis posterior tendonitis  Rationale for Evaluation and Treatment: Rehabilitation  ONSET DATE: 01/08/23  SUBJECTIVE:   SUBJECTIVE STATEMENT: Patient received MRI results, shows posterior tib tendon tear.   PERTINENT HISTORY: 52 y.o. male with left medial ankle pain.  Patient has a history of partial tear of the posterior tibialis tendon in the past.  He reinjured the tendon lifting weights recently and has symptoms that are consistent with tear.   On ultrasound he has evidence of significant tenosynovitis and partial tear.  I do not see a full-thickness tear on ultrasound but I could be missing it. Plan to treat with cam walker boot for a week or 2 and start physical therapy in a week or 2.  Will use nitroglycerin patch protocol.  Recheck in 1 month.  Consider MRI if not improving pretty quickly. Also recommend night splint  PAIN:  Are you having pain? Yes: NPRS scale: 5/10 Pain location: L ankle, medial side but it radiates to the outside and front Pain description: dull and achy Aggravating factors: walking w/o shoes, walking in general  Relieving factors: pain meds, nitro patches  PRECAUTIONS: None  WEIGHT BEARING RESTRICTIONS: No  FALLS:  Has patient fallen in last 6 months? No  LIVING ENVIRONMENT: Lives with: lives with their family Lives in: House/apartment Stairs: Yes: Internal: 20 steps; wall on one side Has following equipment at home: None  OCCUPATION: owns a factory   PLOF: Independent  PATIENT GOALS: I want to play golf soon, no pain and figure out what exercises I can do without further damaging it    OBJECTIVE:   DIAGNOSTIC FINDINGS: Diagnostic Limited MSK Ultrasound of: Left medial ankle Posterior tibialis tendon visualized.  Significant hypoechoic fluid tracking in the tendon sheath around the posterior tibialis tendon with massive fluid accumulation just distal to the tarsal tunnel area. The tendon does not have a full-thickness tear that I can  see however the tendon does appear to be partially torn or disrupted as it transitions across the tarsal tunnel.  Impression: Possible partial tear of the posterior tibialis tendon with very significant tenosynovitis.  PATIENT SURVEYS:  FOTO 40  COGNITION: Overall cognitive status: Within functional limits for tasks assessed     SENSATION: WFL  PALPATION: Swelling and warm, TTP around posterior tib tendon  LOWER EXTREMITY ROM: grossly WFL, some loss of  motion with DF due to increased swelling   LOWER EXTREMITY MMT: 5/5   GAIT: Distance walked: in clinic Assistive device utilized: None Level of assistance: Modified independence Comments: antalgic gait, dons a CAM boot,    TODAY'S TREATMENT:                                                                                                                              DATE: 02/08/23 Elliptical L1 x7mins  Calf stretch 30s  Calf raises 2x10  SLS L ankle 5s at best  Rockerboard standing in // bars  Walking on beam SL on beam 5s at best  Tandem on beam 30s  L ankle TB red 2x10  Vaso to L ankle 10 mins med pressure  02/06/23 Ankle rocker board seated  L ankle TB red x20  Bike L2 x52mins  Calf stretch on slant 30s  Calf raises 2x10 [place] Posterior tibias tendon 3.3Mhz 1.1 w/cm2 PROM to L ankle all directions   01/30/23 L ankle AROM in all directions Ankle 4 way Tband red x15 Ankle ROM sit fit all directions   01/22/23-EVAL     PATIENT EDUCATION:  Education details: HEP and POC Person educated: Patient Education method: Explanation Education comprehension: verbalized understanding  HOME EXERCISE PROGRAM: Access Code: [email or web address] URL: [email or web address] Date: 01/22/2023 Prepared by: Cassie Freer  Exercises - Heel Raises with Counter Support  - 1 x daily - 7 x weekly - 2 sets - 10 reps - Heel Toe Raises with Counter Support  - 1 x daily - 7 x weekly - 2 sets - 10 reps - Ankle Dorsiflexion with Resistance  - 1 x daily - 7 x weekly - 2 sets - 10 reps - Ankle Eversion with Resistance  - 1 x daily - 7 x weekly - 2 sets - 10 reps - Ankle Inversion with Resistance  - 1 x daily - 7 x weekly - 2 sets - 10 reps - Long Sitting Ankle Plantar Flexion with Resistance  - 1 x daily - 7 x weekly - 2 sets - 10 reps - Gastroc Stretch on Wall  - 1 x daily - 7 x weekly - 30 hold - Seated Self Great Toe Stretch  - 1 x daily - 7 x weekly - 30 hold  ASSESSMENT:  CLINICAL  IMPRESSION: Patient returns with no changes in L ankle. Was confirmed to have a posterior tib partial tear. We continue working on some foot and ankle strengthening. Ended with vaso to his L ankle  to help with pain and swelling.   OBJECTIVE IMPAIRMENTS: difficulty walking, increased edema, and pain.   ACTIVITY LIMITATIONS: squatting, stairs, and locomotion level  REHAB POTENTIAL: Good  CLINICAL DECISION MAKING: Stable/uncomplicated  EVALUATION COMPLEXITY: Low   GOALS: Goals reviewed with patient? Yes  SHORT TERM GOALS: Target date: 03/05/23  Patient will be independent with initial HEP. Goal status: INITIAL  2.  Patient will present with at least 50% decrease in L ankle swelling  Goal status: INITIAL   LONG TERM GOALS: Target date: 04/16/23  Patient will be independent with advanced/ongoing HEP to improve outcomes and carryover.  Goal status: INITIAL  2.  Patient will report at least 75% improvement in L ankle/foot pain to improve QOL. Baseline: 6/10 but 10/10 in mornings Goal status: INITIAL  3.  Patient will be able to ambulate 500' with normal gait pattern without increased foot/ankle pain to access community.  Baseline: pain with any distance Goal status: INITIAL  4. Patient will be able to ascend/descend stairs without pain to access home and community.  Baseline: pain going up and down Goal status: INITIAL  5.  Patient will report 63 on FOTO to demonstrate improved functional ability. Baseline: 40 Goal status: INITIAL   PLAN:  PT FREQUENCY: 2x/week  PT DURATION: 12 weeks  PLANNED INTERVENTIONS: Therapeutic exercises, Therapeutic activity, Neuromuscular re-education, Balance training, Gait training, Patient/Family education, Self Care, Joint mobilization, Stair training, Dry Needling, Electrical stimulation, Cryotherapy, Moist heat, Taping, Vasopneumatic device, Ultrasound, Ionotophoresis 4mg /ml Dexamethasone, and Manual therapy  PLAN FOR NEXT SESSION: light  strengthening for L ankle, mostly work on pain management- vaso, ionto, maybe ultrasound and stretching    [employer], PT 02/08/2023, 8:43 AM

## 2023-02-08 ENCOUNTER — Ambulatory Visit: Payer: No Typology Code available for payment source

## 2023-02-08 DIAGNOSIS — M25572 Pain in left ankle and joints of left foot: Secondary | ICD-10-CM

## 2023-02-08 DIAGNOSIS — R262 Difficulty in walking, not elsewhere classified: Secondary | ICD-10-CM

## 2023-02-08 DIAGNOSIS — M659 Synovitis and tenosynovitis, unspecified: Secondary | ICD-10-CM

## 2023-02-08 DIAGNOSIS — M76822 Posterior tibial tendinitis, left leg: Secondary | ICD-10-CM

## 2023-02-08 NOTE — Progress Notes (Unsigned)
   Rubin Payor, PhD, LAT, ATC acting as a scribe for Clementeen Graham, MD.  Kyle Dunn is a 52 y.o. male who presents to [facility] today for follow-up left ankle pain that he injured doing a 950 pound leg press and MRI review.  Patient was last seen by Dr. Denyse Amass on 01/08/2023 and was advised to use a cam walker boot for 1 to 2 weeks, nitro patches were prescribed, and was advised to use a night splint.  Patient called the office on 01/29/2023 complaining of continued severe pain and Dr. Jean Rosenthal advised to continue conservative management until Dr. Zollie Pee return.  Patient called the office again the following day and a ankle MRI was ordered.  Today, patient reports ***  Dx imaging: 02/05/2023 L ankle MRI  01/08/2023 L ankle x-ray  Pertinent review of systems: ***  Relevant historical information: ***   Exam:  There were no vitals taken for this visit. General: Well Developed, well nourished, and in no acute distress.   MSK: ***    Lab and Radiology Results No results found for this or any previous visit (from the past 72 hour(s)). MR ANKLE LEFT WO CONTRAST  Result Date: 02/07/2023 CLINICAL DATA:  left ankle pain, r/o tear, hx of partial thickness tear of the posterior tibialis tendon EXAM: MRI OF THE LEFT ANKLE WITHOUT CONTRAST TECHNIQUE: Multiplanar, multisequence MR imaging of the ankle was performed. No intravenous contrast was administered. COMPARISON:  Left ankle radiograph 01/08/2023 FINDINGS: TENDONS Peroneal: Intact peroneus longus and peroneus brevis tendons. Posteromedial: There is tenosynovitis of the posterior tibial tendon above and below the medial malleolus. There is focal curvilinear low signal intensity tissue along the posterior aspect of the PT tendon above the malleolus (axial T2 image 10), favored to reflect a torn and retracted tendon flap. There is partial tearing of the inframalleolar PT tendon at the level of the talar  head/neck, from which this tissue could potentially originate (axial T2 images 22-23). Anterior: Intact tibialis anterior, extensor hallucis longus and extensor digitorum longus tendons. Achilles: Intact. Plantar Fascia: Intact. LIGAMENTS Lateral: Anterior talofibular ligament intact. Calcaneofibular ligament intact. Posterior talofibular ligament intact. Anterior and posterior tibiofibular ligaments intact. Medial: Deltoid ligament intact. Spring ligament intact. CARTILAGE Ankle Joint: No joint effusion. Mild tibiotalar degenerative change. No osteochondral defect. Subtalar Joints/Sinus Tarsi: Normal subtalar joints. No subtalar joint effusion. Normal sinus tarsi. Distended gruberi bursa. Bones: No evidence of acute fracture. No focal bone lesion. Reactive marrow edema in the medial malleolus. Soft Tissue: Medial ankle soft tissue swelling. No focal fluid collection. IMPRESSION: Partial tear of the inframalleolar posterior tibial tendon with retraction of peripheral tendon fibers to above the malleolus. Reactive tenosynovitis. Adjacent medial ankle soft tissue swelling. Intact ankle ligaments. Mild tibiotalar degenerative arthritis. No acute osseous abnormality. Electronically Signed   By: Caprice Renshaw M.D.   On: 02/07/2023 15:46       Assessment and Plan: 52 y.o. male with ***   PDMP not reviewed this encounter. No orders of the defined types were placed in this encounter.  No orders of the defined types were placed in this encounter.    Discussed warning signs or symptoms. Please see discharge instructions. Patient expresses understanding.   ***

## 2023-02-08 NOTE — Progress Notes (Signed)
MRI shows a posterior tibialis tendon tear.  Fortunately it is a partial tear.  The surrounding tissue to the tendon is irritated and inflamed looking.  This is very likely the source of your pain.  You do have a little bit of arthritis but that is probably a lesser factor. Would you like a referral to a surgeon or do want to try treating it conservatively?  We can return to clinic and talk about your options and go over the results of the MRI fully it next week if you would like.

## 2023-02-11 ENCOUNTER — Ambulatory Visit (INDEPENDENT_AMBULATORY_CARE_PROVIDER_SITE_OTHER): Payer: No Typology Code available for payment source | Admitting: Family Medicine

## 2023-02-11 ENCOUNTER — Encounter: Payer: Self-pay | Admitting: Family Medicine

## 2023-02-11 VITALS — BP 120/82 | HR 91 | Ht 66.0 in | Wt 191.0 lb

## 2023-02-11 DIAGNOSIS — M76822 Posterior tibial tendinitis, left leg: Secondary | ICD-10-CM

## 2023-02-11 NOTE — Patient Instructions (Signed)
Thank you for coming in today.   Plan for surgical consult.   Continue modified shoe and compression sleeve and exercises.

## 2023-02-12 ENCOUNTER — Ambulatory Visit: Payer: No Typology Code available for payment source | Admitting: Physical Therapy

## 2023-02-12 ENCOUNTER — Encounter: Payer: Self-pay | Admitting: Physical Therapy

## 2023-02-12 ENCOUNTER — Telehealth: Payer: Self-pay

## 2023-02-12 DIAGNOSIS — M76822 Posterior tibial tendinitis, left leg: Secondary | ICD-10-CM

## 2023-02-12 DIAGNOSIS — M25572 Pain in left ankle and joints of left foot: Secondary | ICD-10-CM | POA: Diagnosis not present

## 2023-02-12 DIAGNOSIS — R262 Difficulty in walking, not elsewhere classified: Secondary | ICD-10-CM

## 2023-02-12 NOTE — Telephone Encounter (Signed)
Pt scheduled for consult with Dr. Lajoyce Corners 02/19/23.

## 2023-02-12 NOTE — Telephone Encounter (Signed)
Per Dr. Denyse Amass:  MRI shows a posterior tibialis tendon tear.  Fortunately it is a partial tear.  The surrounding tissue to the tendon is irritated and inflamed looking.  This is very likely the source of your pain.  You do have a little bit of arthritis but that is probably a lesser factor. Would you like a referral to a surgeon or do want to try treating it conservatively?  We can return to clinic and talk about your options and go over the results of the MRI fully it next week if you would like.

## 2023-02-12 NOTE — Therapy (Signed)
OUTPATIENT PHYSICAL THERAPY LOWER EXTREMITY TREATMENT   Patient Name: Kyle Dunn MRN: [id number] DOB:Nov 15, 1970, 52 y.o., male Today's Date: 02/12/2023  END OF SESSION:  PT End of Session - 02/12/23 0806     Visit Number 5    Date for PT Re-Evaluation 04/16/23    PT Start Time 0806    PT Stop Time 0845    PT Time Calculation (min) 39 min    Activity Tolerance Patient tolerated treatment well    Behavior During Therapy [facility] for tasks assessed/performed              Past Medical History:  Diagnosis Date   ADD (attention deficit disorder)    Herniated disc, cervical    x2   Pain, neck 2019   w/ L radiculopathy, better w/ conservative RX   Past Surgical History:  Procedure Laterality Date   KNEE ARTHROSCOPY     R at age 13   Patient Active Problem List   Diagnosis Date Noted   Gynecomastia 10/15/2019   Dyslipidemia 04/17/2019   Cervical disc disorder with radiculopathy of cervical region 07/24/2016   Annual physical exam 02/02/2016   PCP NOTES >>>>>>>>>>>>>>>>>>>>>>>>>>>>>>>> 02/02/2016   Patellar tendinitis of right knee 02/15/2015   Gait abnormality 03/26/2012   Posterior tibial tendinitis of left leg 02/12/2012   Attention deficit disorder 11/13/2006    PCP: Willow Ora  REFERRING PROVIDER: Clementeen Graham  REFERRING DIAG: P68.864, [phone]  THERAPY DIAG:  Pain in left ankle and joints of left foot  Difficulty in walking, not elsewhere classified  Left tibialis posterior tendonitis  Rationale for Evaluation and Treatment: Rehabilitation  ONSET DATE: 01/08/23  SUBJECTIVE:   SUBJECTIVE STATEMENT: A lot better, new orthotic takes the pressure off  PERTINENT HISTORY: 52 y.o. male with left medial ankle pain.  Patient has a history of partial tear of the posterior tibialis tendon in the past.  He reinjured the tendon lifting weights recently and has symptoms that are consistent with tear.  On ultrasound he has evidence of significant tenosynovitis  and partial tear.  I do not see a full-thickness tear on ultrasound but I could be missing it. Plan to treat with cam walker boot for a week or 2 and start physical therapy in a week or 2.  Will use nitroglycerin patch protocol.  Recheck in 1 month.  Consider MRI if not improving pretty quickly. Also recommend night splint  PAIN:  Are you having pain? Yes: NPRS scale: 3/10 Pain location: L ankle, medial side but it radiates to the outside and front Pain description: dull and achy Aggravating factors: walking w/o shoes, walking in general  Relieving factors: pain meds, nitro patches  PRECAUTIONS: None  WEIGHT BEARING RESTRICTIONS: No  FALLS:  Has patient fallen in last 6 months? No  LIVING ENVIRONMENT: Lives with: lives with their family Lives in: House/apartment Stairs: Yes: Internal: 20 steps; wall on one side Has following equipment at home: None  OCCUPATION: owns a factory   PLOF: Independent  PATIENT GOALS: I want to play golf soon, no pain and figure out what exercises I can do without further damaging it    OBJECTIVE:   DIAGNOSTIC FINDINGS: Diagnostic Limited MSK Ultrasound of: Left medial ankle Posterior tibialis tendon visualized.  Significant hypoechoic fluid tracking in the tendon sheath around the posterior tibialis tendon with massive fluid accumulation just distal to the tarsal tunnel area. The tendon does not have a full-thickness tear that I can see however the tendon does appear to  be partially torn or disrupted as it transitions across the tarsal tunnel.  Impression: Possible partial tear of the posterior tibialis tendon with very significant tenosynovitis.  PATIENT SURVEYS:  FOTO 40  COGNITION: Overall cognitive status: Within functional limits for tasks assessed     SENSATION: WFL  PALPATION: Swelling and warm, TTP around posterior tib tendon  LOWER EXTREMITY ROM: grossly WFL, some loss of motion with DF due to increased swelling   LOWER  EXTREMITY MMT: 5/5   GAIT: Distance walked: in clinic Assistive device utilized: None Level of assistance: Modified independence Comments: antalgic gait, dons a CAM boot,    TODAY'S TREATMENT:                                                                                                                              DATE: 02/12/23 Bike L 3 x 6 min STM to tibialis Anterior    02/08/23 Elliptical L1 x404mins  Calf stretch 30s  Calf raises 2x10  SLS L ankle 5s at best  Rockerboard standing in // bars  Walking on beam SL on beam 5s at best  Tandem on beam 30s  L ankle TB red 2x10  Vaso to L ankle 10 mins med pressure  02/06/23 Ankle rocker board seated  L ankle TB red x20  Bike L2 x175mins  Calf stretch on slant 30s  Calf raises 2x10 US Posterior tibias tendon 3.3Mhz 1.1 w/cm2 PROM to L ankle all directions   01/30/23 L ankle AROM in all directions Ankle 4 way Tband red x15 Ankle ROM sit fit all directions   01/22/23-EVAL     PATIENT EDUCATION:  Education details: HEP and POC Person educated: Patient Education method: Explanation Education comprehension: verbalized understanding  HOME EXERCISE PROGRAM: Access Code: [email or web address] URL: [email or web address] Date: 01/22/2023 Prepared by: Cassie FreerMona Sajjad  Exercises - Heel Raises with Counter Support  - 1 x daily - 7 x weekly - 2 sets - 10 reps - Heel Toe Raises with Counter Support  - 1 x daily - 7 x weekly - 2 sets - 10 reps - Ankle Dorsiflexion with Resistance  - 1 x daily - 7 x weekly - 2 sets - 10 reps - Ankle Eversion with Resistance  - 1 x daily - 7 x weekly - 2 sets - 10 reps - Ankle Inversion with Resistance  - 1 x daily - 7 x weekly - 2 sets - 10 reps - Long Sitting Ankle Plantar Flexion with Resistance  - 1 x daily - 7 x weekly - 2 sets - 10 reps - Gastroc Stretch on Wall  - 1 x daily - 7 x weekly - 30 hold - Seated Self Great Toe Stretch  - 1 x daily - 7 x weekly - 30 hold  ASSESSMENT:  CLINICAL  IMPRESSION: Pt requested shorted time due to needed to get to market. Patient returns with reports of some improvement due to new orthotopics and wedges in the  shoe. MRI results revealed partial tear of the posterior tibialis tendon and tendinitis. STM to tibialis anterior de to reports of tightness and soreness from over working. Positive response to STM evident y improved tissue elasticity and subjective reports of decrease soreness.    OBJECTIVE IMPAIRMENTS: difficulty walking, increased edema, and pain.   ACTIVITY LIMITATIONS: squatting, stairs, and locomotion level  REHAB POTENTIAL: Good  CLINICAL DECISION MAKING: Stable/uncomplicated  EVALUATION COMPLEXITY: Low   GOALS: Goals reviewed with patient? Yes  SHORT TERM GOALS: Target date: 03/05/23  Patient will be independent with initial HEP. Goal status: INITIAL  2.  Patient will present with at least 50% decrease in L ankle swelling  Goal status: INITIAL   LONG TERM GOALS: Target date: 04/16/23  Patient will be independent with advanced/ongoing HEP to improve outcomes and carryover.  Goal status: INITIAL  2.  Patient will report at least 75% improvement in L ankle/foot pain to improve QOL. Baseline: 6/10 but 10/10 in mornings Goal status: INITIAL  3.  Patient will be able to ambulate 500' with normal gait pattern without increased foot/ankle pain to access community.  Baseline: pain with any distance Goal status: INITIAL  4. Patient will be able to ascend/descend stairs without pain to access home and community.  Baseline: pain going up and down Goal status: INITIAL  5.  Patient will report 62 on FOTO to demonstrate improved functional ability. Baseline: 40 Goal status: INITIAL   PLAN:  PT FREQUENCY: 2x/week  PT DURATION: 12 weeks  PLANNED INTERVENTIONS: Therapeutic exercises, Therapeutic activity, Neuromuscular re-education, Balance training, Gait training, Patient/Family education, Self Care, Joint  mobilization, Stair training, Dry Needling, Electrical stimulation, Cryotherapy, Moist heat, Taping, Vasopneumatic device, Ultrasound, Ionotophoresis 4mg /ml Dexamethasone, and Manual therapy  PLAN FOR NEXT SESSION: light strengthening for L ankle, mostly work on pain management- vaso, ionto, maybe ultrasound and stretching    Grayce Sessions, PTA 02/12/2023, 8:07 AM

## 2023-02-15 ENCOUNTER — Ambulatory Visit: Payer: No Typology Code available for payment source

## 2023-02-18 ENCOUNTER — Ambulatory Visit: Payer: No Typology Code available for payment source | Admitting: Physical Therapy

## 2023-02-18 ENCOUNTER — Encounter: Payer: Self-pay | Admitting: Physical Therapy

## 2023-02-18 DIAGNOSIS — R262 Difficulty in walking, not elsewhere classified: Secondary | ICD-10-CM

## 2023-02-18 DIAGNOSIS — M76822 Posterior tibial tendinitis, left leg: Secondary | ICD-10-CM

## 2023-02-18 DIAGNOSIS — M25572 Pain in left ankle and joints of left foot: Secondary | ICD-10-CM

## 2023-02-18 DIAGNOSIS — M659 Synovitis and tenosynovitis, unspecified: Secondary | ICD-10-CM

## 2023-02-18 NOTE — Therapy (Signed)
OUTPATIENT PHYSICAL THERAPY LOWER EXTREMITY TREATMENT   Patient Name: Kyle Dunn MRN: [id number] DOB:06-10-1971, 52 y.o., male Today's Date: 02/18/2023  END OF SESSION:  PT End of Session - 02/18/23 0807     Visit Number 6    Date for PT Re-Evaluation 04/16/23    PT Start Time 0807    PT Stop Time 0845    PT Time Calculation (min) 38 min    Activity Tolerance Patient tolerated treatment well    Behavior During Therapy [facility] for tasks assessed/performed              Past Medical History:  Diagnosis Date   ADD (attention deficit disorder)    Herniated disc, cervical    x2   Pain, neck 2019   w/ L radiculopathy, better w/ conservative RX   Past Surgical History:  Procedure Laterality Date   KNEE ARTHROSCOPY     R at age 43   Patient Active Problem List   Diagnosis Date Noted   Gynecomastia 10/15/2019   Dyslipidemia 04/17/2019   Cervical disc disorder with radiculopathy of cervical region 07/24/2016   Annual physical exam 02/02/2016   PCP NOTES >>>>>>>>>>>>>>>>>>>>>>>>>>>>>>>> 02/02/2016   Patellar tendinitis of right knee 02/15/2015   Gait abnormality 03/26/2012   Posterior tibial tendinitis of left leg 02/12/2012   Attention deficit disorder 11/13/2006    PCP: Willow Ora  REFERRING PROVIDER: Clementeen Graham  REFERRING DIAG: V25.366, [phone]  THERAPY DIAG:  Pain in left ankle and joints of left foot  Difficulty in walking, not elsewhere classified  Left tibialis posterior tendonitis  Tenosynovitis of tibialis posterior tendon  Rationale for Evaluation and Treatment: Rehabilitation  ONSET DATE: 01/08/23  SUBJECTIVE:   SUBJECTIVE STATEMENT: Will meet with the surgeon tomorrow, getting better every day. Hurts but not as bad as it use to be.   PERTINENT HISTORY: 52 y.o. male with left medial ankle pain.  Patient has a history of partial tear of the posterior tibialis tendon in the past.  He reinjured the tendon lifting weights recently and has  symptoms that are consistent with tear.  On ultrasound he has evidence of significant tenosynovitis and partial tear.  I do not see a full-thickness tear on ultrasound but I could be missing it. Plan to treat with cam walker boot for a week or 2 and start physical therapy in a week or 2.  Will use nitroglycerin patch protocol.  Recheck in 1 month.  Consider MRI if not improving pretty quickly. Also recommend night splint  PAIN:  Are you having pain? Yes: NPRS scale: 3-5/10 Pain location: L ankle, medial side but it radiates to the outside and front Pain description: dull and achy Aggravating factors: walking w/o shoes, walking in general  Relieving factors: pain meds, nitro patches  PRECAUTIONS: None  WEIGHT BEARING RESTRICTIONS: No  FALLS:  Has patient fallen in last 6 months? No  LIVING ENVIRONMENT: Lives with: lives with their family Lives in: House/apartment Stairs: Yes: Internal: 20 steps; wall on one side Has following equipment at home: None  OCCUPATION: owns a factory   PLOF: Independent  PATIENT GOALS: I want to play golf soon, no pain and figure out what exercises I can do without further damaging it    OBJECTIVE:   DIAGNOSTIC FINDINGS: Diagnostic Limited MSK Ultrasound of: Left medial ankle Posterior tibialis tendon visualized.  Significant hypoechoic fluid tracking in the tendon sheath around the posterior tibialis tendon with massive fluid accumulation just distal to the tarsal tunnel area.  The tendon does not have a full-thickness tear that I can see however the tendon does appear to be partially torn or disrupted as it transitions across the tarsal tunnel.  Impression: Possible partial tear of the posterior tibialis tendon with very significant tenosynovitis.  PATIENT SURVEYS:  FOTO 40  COGNITION: Overall cognitive status: Within functional limits for tasks assessed     SENSATION: WFL  PALPATION: Swelling and warm, TTP around posterior tib tendon  LOWER  EXTREMITY ROM: grossly WFL, some loss of motion with DF due to increased swelling   LOWER EXTREMITY MMT: 5/5   GAIT: Distance walked: in clinic Assistive device utilized: None Level of assistance: Modified independence Comments: antalgic gait, dons a CAM boot,    TODAY'S TREATMENT:                                                                                                                              DATE: 02/18/2023 NuStep L 4 x 5 min LE only  STM to tibialis Anterior  Tandem walking on airex in // bars Resisted gait 30lb all directions x4 Heel raises  SLS 4 x10 sec  Step ups 6in x 10 each  02/12/23 Bike L 3 x 6 min STM to tibialis Anterior    02/08/23 Elliptical L1 x1mins  Calf stretch 30s  Calf raises 2x10  SLS L ankle 5s at best  Rockerboard standing in // bars  Walking on beam SL on beam 5s at best  Tandem on beam 30s  L ankle TB red 2x10  Vaso to L ankle 10 mins med pressure  02/06/23 Ankle rocker board seated  L ankle TB red x20  Bike L2 x48mins  Calf stretch on slant 30s  Calf raises 2x10 [place] Posterior tibias tendon 3.3Mhz 1.1 w/cm2 PROM to L ankle all directions   01/30/23 L ankle AROM in all directions Ankle 4 way Tband red x15 Ankle ROM sit fit all directions   01/22/23-EVAL     PATIENT EDUCATION:  Education details: HEP and POC Person educated: Patient Education method: Explanation Education comprehension: verbalized understanding  HOME EXERCISE PROGRAM: Access Code: [email or web address] URL: [email or web address] Date: 01/22/2023 Prepared by: Cassie Freer  Exercises - Heel Raises with Counter Support  - 1 x daily - 7 x weekly - 2 sets - 10 reps - Heel Toe Raises with Counter Support  - 1 x daily - 7 x weekly - 2 sets - 10 reps - Ankle Dorsiflexion with Resistance  - 1 x daily - 7 x weekly - 2 sets - 10 reps - Ankle Eversion with Resistance  - 1 x daily - 7 x weekly - 2 sets - 10 reps - Ankle Inversion with Resistance  - 1 x daily - 7  x weekly - 2 sets - 10 reps - Long Sitting Ankle Plantar Flexion with Resistance  - 1 x daily - 7 x weekly - 2 sets - 10 reps - Gastroc Stretch on Wall  -  1 x daily - 7 x weekly - 30 hold - Seated Self Great Toe Stretch  - 1 x daily - 7 x weekly - 30 hold  ASSESSMENT:  CLINICAL IMPRESSION: Pt will meet with the surgeon tomorrow. Some tenderness with MT. Weakness and discomfort with resisted gait. Instability present with tandem walking and SLS. Good effort throughout session.    OBJECTIVE IMPAIRMENTS: difficulty walking, increased edema, and pain.   ACTIVITY LIMITATIONS: squatting, stairs, and locomotion level  REHAB POTENTIAL: Good  CLINICAL DECISION MAKING: Stable/uncomplicated  EVALUATION COMPLEXITY: Low   GOALS: Goals reviewed with patient? Yes  SHORT TERM GOALS: Target date: 03/05/23  Patient will be independent with initial HEP. Goal status: Met   2.  Patient will present with at least 50% decrease in L ankle swelling  Goal status: INITIAL   LONG TERM GOALS: Target date: 04/16/23  Patient will be independent with advanced/ongoing HEP to improve outcomes and carryover.  Goal status: INITIAL  2.  Patient will report at least 75% improvement in L ankle/foot pain to improve QOL. Baseline: 6/10 but 10/10 in mornings Goal status: INITIAL  3.  Patient will be able to ambulate 500' with normal gait pattern without increased foot/ankle pain to access community.  Baseline: pain with any distance Goal status: partly met   4. Patient will be able to ascend/descend stairs without pain to access home and community.  Baseline: pain going up and down Goal status: INITIAL  5.  Patient will report 43 on FOTO to demonstrate improved functional ability. Baseline: 40 Goal status: INITIAL   PLAN:  PT FREQUENCY: 2x/week  PT DURATION: 12 weeks  PLANNED INTERVENTIONS: Therapeutic exercises, Therapeutic activity, Neuromuscular re-education, Balance training, Gait training,  Patient/Family education, Self Care, Joint mobilization, Stair training, Dry Needling, Electrical stimulation, Cryotherapy, Moist heat, Taping, Vasopneumatic device, Ultrasound, Ionotophoresis 4mg /ml Dexamethasone, and Manual therapy  PLAN FOR NEXT SESSION: light strengthening for L ankle, mostly work on pain management- vaso, ionto, maybe ultrasound and stretching    Grayce Sessions, PTA 02/18/2023, 8:07 AM

## 2023-02-19 ENCOUNTER — Ambulatory Visit (INDEPENDENT_AMBULATORY_CARE_PROVIDER_SITE_OTHER): Payer: No Typology Code available for payment source | Admitting: Orthopedic Surgery

## 2023-02-19 DIAGNOSIS — M76822 Posterior tibial tendinitis, left leg: Secondary | ICD-10-CM | POA: Diagnosis not present

## 2023-02-21 ENCOUNTER — Ambulatory Visit: Payer: No Typology Code available for payment source | Admitting: Physical Therapy

## 2023-02-21 ENCOUNTER — Encounter: Payer: Self-pay | Admitting: Physical Therapy

## 2023-02-21 DIAGNOSIS — M25572 Pain in left ankle and joints of left foot: Secondary | ICD-10-CM

## 2023-02-21 DIAGNOSIS — M659 Synovitis and tenosynovitis, unspecified: Secondary | ICD-10-CM

## 2023-02-21 DIAGNOSIS — M76822 Posterior tibial tendinitis, left leg: Secondary | ICD-10-CM

## 2023-02-21 DIAGNOSIS — R262 Difficulty in walking, not elsewhere classified: Secondary | ICD-10-CM

## 2023-02-21 NOTE — Therapy (Signed)
OUTPATIENT PHYSICAL THERAPY LOWER EXTREMITY TREATMENT   Patient Name: Kyle Dunn MRN: [id number] DOB:1971/01/21, 52 y.o., male Today's Date: 02/21/2023  END OF SESSION:  PT End of Session - 02/21/23 0802     Visit Number 7    Date for PT Re-Evaluation 04/16/23    PT Start Time 0802    PT Stop Time 0930    PT Time Calculation (min) 88 min    Activity Tolerance Patient tolerated treatment well    Behavior During Therapy WFL for tasks assessed/performed              Past Medical History:  Diagnosis Date   ADD (attention deficit disorder)    Herniated disc, cervical    x2   Pain, neck 2019   w/ L radiculopathy, better w/ conservative RX   Past Surgical History:  Procedure Laterality Date   KNEE ARTHROSCOPY     R at age 27   Patient Active Problem List   Diagnosis Date Noted   Gynecomastia 10/15/2019   Dyslipidemia 04/17/2019   Cervical disc disorder with radiculopathy of cervical region 07/24/2016   Annual physical exam 02/02/2016   PCP NOTES >>>>>>>>>>>>>>>>>>>>>>>>>>>>>>>> 02/02/2016   Patellar tendinitis of right knee 02/15/2015   Gait abnormality 03/26/2012   Posterior tibial tendinitis of left leg 02/12/2012   Attention deficit disorder 11/13/2006    PCP: Willow Ora  REFERRING PROVIDER: Clementeen Graham  REFERRING DIAG: W09.811, [phone]  THERAPY DIAG:  Pain in left ankle and joints of left foot  Left tibialis posterior tendonitis  Tenosynovitis of tibialis posterior tendon  Difficulty in walking, not elsewhere classified  Rationale for Evaluation and Treatment: Rehabilitation  ONSET DATE: 01/08/23  SUBJECTIVE:   SUBJECTIVE STATEMENT: Surgery is the last resort. Pt stated surgeon wants him to do isometrics   PERTINENT HISTORY: 52 y.o. male with left medial ankle pain.  Patient has a history of partial tear of the posterior tibialis tendon in the past.  He reinjured the tendon lifting weights recently and has symptoms that are consistent  with tear.  On ultrasound he has evidence of significant tenosynovitis and partial tear.  I do not see a full-thickness tear on ultrasound but I could be missing it. Plan to treat with cam walker boot for a week or 2 and start physical therapy in a week or 2.  Will use nitroglycerin patch protocol.  Recheck in 1 month.  Consider MRI if not improving pretty quickly. Also recommend night splint  PAIN:  Are you having pain? Yes: NPRS scale: 4/10 Pain location: L ankle, medial side but it radiates to the outside and front Pain description: stiffness Aggravating factors: walking w/o shoes, walking in general  Relieving factors: pain meds, nitro patches  PRECAUTIONS: None  WEIGHT BEARING RESTRICTIONS: No  FALLS:  Has patient fallen in last 6 months? No  LIVING ENVIRONMENT: Lives with: lives with their family Lives in: House/apartment Stairs: Yes: Internal: 20 steps; wall on one side Has following equipment at home: None  OCCUPATION: owns a factory   PLOF: Independent  PATIENT GOALS: I want to play golf soon, no pain and figure out what exercises I can do without further damaging it    OBJECTIVE:   DIAGNOSTIC FINDINGS: Diagnostic Limited MSK Ultrasound of: Left medial ankle Posterior tibialis tendon visualized.  Significant hypoechoic fluid tracking in the tendon sheath around the posterior tibialis tendon with massive fluid accumulation just distal to the tarsal tunnel area. The tendon does not have a full-thickness tear that  I can see however the tendon does appear to be partially torn or disrupted as it transitions across the tarsal tunnel.  Impression: Possible partial tear of the posterior tibialis tendon with very significant tenosynovitis.  PATIENT SURVEYS:  FOTO 40  COGNITION: Overall cognitive status: Within functional limits for tasks assessed     SENSATION: WFL  PALPATION: Swelling and warm, TTP around posterior tib tendon  LOWER EXTREMITY ROM: grossly WFL, some  loss of motion with DF due to increased swelling   LOWER EXTREMITY MMT: 5/5   GAIT: Distance walked: in clinic Assistive device utilized: None Level of assistance: Modified independence Comments: antalgic gait, dons a CAM boot,    TODAY'S TREATMENT:                                                                                                                              DATE: 02/21/23 Bike L 3 x 7  min L ankle isometrics in all directions 5x5'' Toe and heel walking  SLS LLE 5x10''  Side step and tandem walking on airex in // bars  STM to tibialis Anterior   02/18/2023 NuStep L 4 x 5 min LE only  STM to tibialis Anterior  Tandem walking on airex in // bars Resisted gait 30lb all directions x4 Heel raises  SLS 4 x10 sec  Step ups 6in x 10 each  02/12/23 Bike L 3 x 6 min STM to tibialis Anterior    02/08/23 Elliptical L1 x37mins  Calf stretch 30s  Calf raises 2x10  SLS L ankle 5s at best  Rockerboard standing in // bars  Walking on beam SL on beam 5s at best  Tandem on beam 30s  L ankle TB red 2x10  Vaso to L ankle 10 mins med pressure  02/06/23 Ankle rocker board seated  L ankle TB red x20  Bike L2 x65mins  Calf stretch on slant 30s  Calf raises 2x10 [place] Posterior tibias tendon 3.3Mhz 1.1 w/cm2 PROM to L ankle all directions   01/30/23 L ankle AROM in all directions Ankle 4 way Tband red x15 Ankle ROM sit fit all directions   01/22/23-EVAL     PATIENT EDUCATION:  Education details: HEP and POC Person educated: Patient Education method: Explanation Education comprehension: verbalized understanding  HOME EXERCISE PROGRAM: Access Code: [email or web address] URL: [email or web address] Date: 01/22/2023 Prepared by: Cassie Freer  Exercises - Heel Raises with Counter Support  - 1 x daily - 7 x weekly - 2 sets - 10 reps - Heel Toe Raises with Counter Support  - 1 x daily - 7 x weekly - 2 sets - 10 reps - Ankle Dorsiflexion with Resistance  - 1 x daily - 7  x weekly - 2 sets - 10 reps - Ankle Eversion with Resistance  - 1 x daily - 7 x weekly - 2 sets - 10 reps - Ankle Inversion with Resistance  - 1 x daily - 7 x weekly - 2  sets - 10 reps - Long Sitting Ankle Plantar Flexion with Resistance  - 1 x daily - 7 x weekly - 2 sets - 10 reps - Gastroc Stretch on Wall  - 1 x daily - 7 x weekly - 30 hold - Seated Self Great Toe Stretch  - 1 x daily - 7 x weekly - 30 hold  ASSESSMENT:  CLINICAL IMPRESSION:  Some tenderness with MT remains. Session focused on isometric interventions as well as balance. Weakness was present with inversion isometric.  Instability present with tandem walking and SLS. Good effort throughout session.    OBJECTIVE IMPAIRMENTS: difficulty walking, increased edema, and pain.   ACTIVITY LIMITATIONS: squatting, stairs, and locomotion level  REHAB POTENTIAL: Good  CLINICAL DECISION MAKING: Stable/uncomplicated  EVALUATION COMPLEXITY: Low   GOALS: Goals reviewed with patient? Yes  SHORT TERM GOALS: Target date: 03/05/23  Patient will be independent with initial HEP. Goal status: Met   2.  Patient will present with at least 50% decrease in L ankle swelling  Goal status: Met 02/21/23   LONG TERM GOALS: Target date: 04/16/23  Patient will be independent with advanced/ongoing HEP to improve outcomes and carryover.  Goal status: INITIAL  2.  Patient will report at least 75% improvement in L ankle/foot pain to improve QOL. Baseline: 6/10 but 10/10 in mornings Goal status: INITIAL  3.  Patient will be able to ambulate 500' with normal gait pattern without increased foot/ankle pain to access community.  Baseline: pain with any distance Goal status: partly met   4. Patient will be able to ascend/descend stairs without pain to access home and community.  Baseline: pain going up and down Goal status: INITIAL  5.  Patient will report 68 on FOTO to demonstrate improved functional ability. Baseline: 40 Goal status:  INITIAL   PLAN:  PT FREQUENCY: 2x/week  PT DURATION: 12 weeks  PLANNED INTERVENTIONS: Therapeutic exercises, Therapeutic activity, Neuromuscular re-education, Balance training, Gait training, Patient/Family education, Self Care, Joint mobilization, Stair training, Dry Needling, Electrical stimulation, Cryotherapy, Moist heat, Taping, Vasopneumatic device, Ultrasound, Ionotophoresis 4mg /ml Dexamethasone, and Manual therapy  PLAN FOR NEXT SESSION: light strengthening for L ankle, mostly work on pain management- vaso, ionto, maybe ultrasound and stretching    Grayce Sessions, PTA 02/21/2023, 8:03 AM

## 2023-02-24 ENCOUNTER — Encounter: Payer: Self-pay | Admitting: Orthopedic Surgery

## 2023-02-24 NOTE — Progress Notes (Signed)
Office Visit Note   Patient: Kyle Dunn           Date of Birth: 02-24-1971           MRN: [id number] Visit Date: 02/19/2023              Requested by: Rodolph Bong, MD [address] [place],  [place] [address] PCP: Wanda Plump, MD  Chief Complaint  Patient presents with   Left Ankle - Pain      HPI: Patient is a 52 year old gentleman who is seen for initial evaluation for posterior tibial tendinitis left ankle.  Patient is status post MRI scan April 2.  Patient states he injured the tendon while lifting weights recently.  He reports a collapsed arch.  Patient states he has increased pain with the first step in the morning.  Patient is currently wearing arch support orthotics.  Assessment & Plan: Visit Diagnoses:  1. Posterior tibial tendinitis of left lower extremity     Plan: Recommended Achilles, posterior tibial tendon, fascial strengthening this was demonstrated.  Follow-Up Instructions: Return if symptoms worsen or fail to improve.   Ortho Exam  Patient is alert, oriented, no adenopathy, well-dressed, normal affect, normal respiratory effort. Examination patient is a good pulse.  Patient can do a toe raise with good posterior tibial tendon function.  He has a decreased arch height on the left compared to the right.  There is tenderness along the posterior tibial tendon.  Review of the MRI scan shows a small tear along the longitudinal aspect of the posterior tibial tendon with synovitis.  Imaging: No results found. No images are attached to the encounter.  Labs: No results found for: "HGBA1C", "ESRSEDRATE", "CRP", "LABURIC", "REPTSTATUS", "GRAMSTAIN", "CULT", "LABORGA"   Lab Results  Component Value Date   ALBUMIN 5.0 05/18/2022   ALBUMIN 4.8 04/11/2021   ALBUMIN 4.8 10/08/2018    No results found for: "MG" No results found for: "VD25OH"  No results found for: "PREALBUMIN"    Latest Ref Rng & Units 05/18/2022   11:07 AM 04/11/2021    3:46  PM 10/08/2018    9:37 AM  CBC EXTENDED  WBC 4.0 - 10.5 K/uL 5.6  5.3  4.7   RBC 4.22 - 5.81 Mil/uL 5.49  5.41  5.53   Hemoglobin 13.0 - 17.0 g/dL 62.1  30.8  65.7   HCT 39.0 - 52.0 % 47.9  46.1  48.1   Platelets 150.0 - 400.0 K/uL 225.0  229.0  230.0   NEUT# 1.4 - 7.7 K/uL 3.1  3.0  2.5   Lymph# 0.7 - 4.0 K/uL 1.7  1.6  1.3      There is no height or weight on file to calculate BMI.  Orders:  No orders of the defined types were placed in this encounter.  No orders of the defined types were placed in this encounter.    Procedures: No procedures performed  Clinical Data: No additional findings.  ROS:  All other systems negative, except as noted in the HPI. Review of Systems  Objective: Vital Signs: There were no vitals taken for this visit.  Specialty Comments:  No specialty comments available.  PMFS History: Patient Active Problem List   Diagnosis Date Noted   Gynecomastia 10/15/2019   Dyslipidemia 04/17/2019   Cervical disc disorder with radiculopathy of cervical region 07/24/2016   Annual physical exam 02/02/2016   PCP NOTES >>>>>>>>>>>>>>>>>>>>>>>>>>>>>>>> 02/02/2016   Patellar tendinitis of right knee 02/15/2015  Gait abnormality 03/26/2012   Posterior tibial tendinitis of left leg 02/12/2012   Attention deficit disorder 11/13/2006   Past Medical History:  Diagnosis Date   ADD (attention deficit disorder)    Herniated disc, cervical    x2   Pain, neck 2019   w/ L radiculopathy, better w/ conservative RX    Family History  Problem Relation Age of Onset   Colon cancer Father        dx in his 103s   Alcohol abuse Father    CAD Neg Hx    Diabetes Neg Hx    Prostate cancer Neg Hx    Other Neg Hx        gynecomastia    Past Surgical History:  Procedure Laterality Date   KNEE ARTHROSCOPY     R at age 37   Social History   Occupational History   Occupation: Ecologist: OTHER   Occupation: owns a [employer]  Tobacco Use   Smoking  status: Never   Smokeless tobacco: Never  Substance and Sexual Activity   Alcohol use: Yes    Alcohol/week: 0.0 standard drinks of alcohol    Comment: socially    Drug use: No   Sexual activity: Not on file

## 2023-02-25 ENCOUNTER — Other Ambulatory Visit: Payer: Self-pay | Admitting: Sports Medicine

## 2023-02-27 ENCOUNTER — Telehealth: Payer: Self-pay | Admitting: *Deleted

## 2023-02-27 ENCOUNTER — Ambulatory Visit: Payer: No Typology Code available for payment source | Admitting: Internal Medicine

## 2023-02-27 NOTE — Telephone Encounter (Signed)
Who Is Calling Patient / Member / Family / Caregiver Caller Name Finneas Mathe Caller Phone Number [phone] Patient Name Kyle Dunn Patient DOB August 09, 1971 Call Type Message Only Information Provided Reason for Call Request to [facility] Appointment Initial Comment Caller states he is needing to cancel an appointment for 4/24. Patient request to speak to RN No Disp. Time Disposition Final User 02/26/2023 5:11:34 PM General Information Provided Yes Nevin Bloodgood Call Closed By: Nevin Bloodgood Transaction Date/Time: 02/26/2023 5:10:01 PM (ET

## 2023-02-27 NOTE — Telephone Encounter (Signed)
Left message on machine for patient to call back to reschedule.

## 2023-08-05 ENCOUNTER — Telehealth: Payer: Self-pay | Admitting: Internal Medicine

## 2023-08-05 NOTE — Telephone Encounter (Signed)
Pt called stating that he is heading to [place] and wanted to make sure his tDap and cipro was up to date. Per Health maintenance, pt is due for tdap vaccine but unsure on cipro. Please Advise.

## 2023-08-05 NOTE — Telephone Encounter (Signed)
Last OV 05/2022- please schedule him an appt- this can be discussed with provider at time of visit. Thank you.

## 2023-08-23 ENCOUNTER — Ambulatory Visit (INDEPENDENT_AMBULATORY_CARE_PROVIDER_SITE_OTHER): Payer: No Typology Code available for payment source

## 2023-08-23 ENCOUNTER — Ambulatory Visit (INDEPENDENT_AMBULATORY_CARE_PROVIDER_SITE_OTHER): Payer: No Typology Code available for payment source | Admitting: Podiatry

## 2023-08-23 ENCOUNTER — Encounter: Payer: Self-pay | Admitting: Podiatry

## 2023-08-23 VITALS — Wt 191.0 lb

## 2023-08-23 DIAGNOSIS — M76822 Posterior tibial tendinitis, left leg: Secondary | ICD-10-CM | POA: Diagnosis not present

## 2023-08-23 DIAGNOSIS — Q666 Other congenital valgus deformities of feet: Secondary | ICD-10-CM | POA: Diagnosis not present

## 2023-08-23 NOTE — Progress Notes (Signed)
Subjective:  Patient ID: Kyle Dunn, male    DOB: 18-Jan-1971,  MRN: [id number]  Chief Complaint  Patient presents with   Foot Pain    Patient here for PTT follow up    Discussed the use of AI scribe software for clinical note transcription with the patient, who gave verbal consent to proceed.  History of Present Illness          52 year old male with a history of flat feet and a previous tendon tear, presents with chronic left ankle pain. The pain began after a leg press injury several months ago and has been constant since. The patient describes the pain as an ache that worsens with certain activities, such as standing on pedals or putting pressure on the foot. Despite various treatments, including nitroglycerin patches, kinesiology tape, orthotics, and physical therapy, the pain persists. The patient also reports a noticeable bump in the painful area. The pain is worse in the morning or after sitting, describing the tendon as "pulling back" and becoming stiff. The patient notes that the pain eases somewhat with movement but has started to transfer to the other side of the foot. Despite the pain, the patient remains active, lifting weights, riding a bike, and playing golf, albeit with modifications due to the pain.   Objective:    Physical Exam         General: AAO x3, NAD  Dermatological: Skin is warm, dry and supple bilateral. There are no open sores, no preulcerative lesions, no rash or signs of infection present.  Vascular: Dorsalis Pedis artery and Posterior Tibial artery pedal pulses are 2/4 bilateral with immedate capillary fill time.  There is no pain with calf compression, swelling, warmth, erythema.   Neruologic: Grossly intact via light touch bilateral.  Musculoskeletal: No acute pulmonary foot noted.  There is tenderness palpation of the course the posterior tibial tendon.  There is proximal to the malleolus.  He is not able to do a single heel rise.  No area  pinpoint tenderness based on exam pain lateral aspect of the foot on the course the peroneal, sinus tarsi likely from consultation.  Gait: Unassisted, Nonantalgic.    No images are attached to the encounter.    Results          Assessment:   1. Posterior tibial tendon dysfunction (PTTD) of left lower extremity      Plan:  Patient was evaluated and treated and all questions answered.  Assessment and Plan           Posterior Tibial Tendon Dysfunction Chronic left ankle pain with history of tear, flat feet, and overuse. Pain is worse in the morning and with certain activities. Unable to perform single leg heel raise on the left. MRI showed partial tearing of the tendon. -We discussed the conservative as well as surgical options.  Not interested in surgical invention at this time but may need to consider this in the future if symptoms progress. -Refer to physical therapy for dry needling and other modalities to stimulate healing and improve strength.  Referral sent to [facility]. -Consider Extracorporeal Pulse Activation Treatment (EPAT) or Platelet-Rich Plasma (PRP) injections if no improvement with physical therapy. -Discussed surgical options including tendon repair and bone work to address flat foot, but patient prefers to try conservative management first.  Compensatory Pain Pain on the lateral aspect of the foot likely due to compensation for the dysfunctional posterior tibial tendon. -Addressed in the physical therapy referral.   No  follow-ups on file.   Vivi Barrack DPM

## 2023-09-11 ENCOUNTER — Telehealth: Payer: Self-pay

## 2023-09-11 NOTE — Telephone Encounter (Signed)
Received PT plan of care from [facility]. Chart reviewed, Dr. Drue Novel did not send referral or place this order for physical therapy. Unable to sign. Form faxed back to [facility] at [phone] unable to sign.

## 2024-02-12 ENCOUNTER — Ambulatory Visit: Admitting: Family Medicine

## 2024-02-12 ENCOUNTER — Encounter: Payer: Self-pay | Admitting: Family Medicine

## 2024-02-12 VITALS — BP 130/86 | HR 86 | Temp 98.0°F | Ht 66.0 in | Wt 192.8 lb

## 2024-02-12 DIAGNOSIS — H9201 Otalgia, right ear: Secondary | ICD-10-CM | POA: Diagnosis not present

## 2024-02-12 MED ORDER — CIPROFLOXACIN-DEXAMETHASONE 0.3-0.1 % OT SUSP: 4.0000 [drp] | Freq: Two times a day (BID) | OTIC | 0 refills | Status: AC

## 2024-02-12 MED ORDER — PREDNISONE 20 MG PO TABS
40.0000 mg | ORAL_TABLET | Freq: Every day | ORAL | 0 refills | Status: AC
Start: 2024-02-12 — End: 2024-02-17

## 2024-02-12 NOTE — Progress Notes (Signed)
 Chief Complaint  Patient presents with   Acute Visit    Patient presents today for a ringing in right ear. He reports some clear drainage for two nights.    Pt is here for right ear pain. Duration: 5 days Progression: better Associated symptoms: clear drainage, ringing, hearing loss (80%) Denies: trauma, bleeding, or fevers Treatment to date: OTC drops  Past Medical History:  Diagnosis Date   ADD (attention deficit disorder)    Herniated disc, cervical    x2   Pain, neck 2019   w/ L radiculopathy, better w/ conservative RX    BP 130/86   Pulse 86   Temp 98 F (36.7 C)   Ht 5\' 6"  (1.676 m)   Wt 192 lb 12.8 oz (87.5 kg)   SpO2 97%   BMI 31.12 kg/m  General: Awake, alert, appearing stated age HEENT:  L ear- Canal patent without drainage or erythema, TM is neg R ear- canal patent without drainage or erythema, tympanic hematoma centrally, I do not appreciate any purulence or bulging Nose- nares patent and without discharge Mouth- Lips, gums and dentition unremarkable, pharynx is without erythema or exudate Neck: No adenopathy Lungs: Normal effort, no accessory muscle use Psych: Age appropriate judgment and insight, normal mood and affect  Right ear pain - Plan: ciprofloxacin-dexamethasone (CIPRODEX) OTIC suspension, predniSONE (DELTASONE) 20 MG tablet  Ciprodex drops in the right ear for 7 to 10 days.  Given the pain and drainage after going on the airplane, would cover for a perforation versus otitis externa.  I want a make sure his eustachian tube is open so we will prescribe a 5-day prednisone burst 40 mg daily.  If still having issues over the next couple weeks, he will send me a message and I will set him up with the ENT team. F/u as originally scheduled with his regular physician.  Pt voiced understanding and agreement to the plan.  Jilda Roche [place], DO 02/12/24 3:57 PM

## 2024-02-12 NOTE — Patient Instructions (Addendum)
 Let me know if there are cost issues with the drops.   Put a cotton ball in your ear for 15 min after putting the drops.   Let us know if you need anything.
